# Patient Record
Sex: Female | Born: 1990 | Race: Black or African American | Hispanic: No | Marital: Married | Smoking: Never smoker
Health system: Southern US, Community
[De-identification: ages and names within clinical notes are randomized; demographics above are authoritative.]

## PROBLEM LIST (undated history)

## (undated) DIAGNOSIS — G43909 Migraine, unspecified, not intractable, without status migrainosus: Secondary | ICD-10-CM

## (undated) DIAGNOSIS — N390 Urinary tract infection, site not specified: Secondary | ICD-10-CM

## (undated) DIAGNOSIS — D219 Benign neoplasm of connective and other soft tissue, unspecified: Secondary | ICD-10-CM

## (undated) DIAGNOSIS — R011 Cardiac murmur, unspecified: Secondary | ICD-10-CM

## (undated) DIAGNOSIS — K219 Gastro-esophageal reflux disease without esophagitis: Secondary | ICD-10-CM

## (undated) DIAGNOSIS — K2970 Gastritis, unspecified, without bleeding: Secondary | ICD-10-CM

## (undated) HISTORY — DX: Cardiac murmur, unspecified: R01.1

## (undated) HISTORY — DX: Migraine, unspecified, not intractable, without status migrainosus: G43.909

## (undated) HISTORY — DX: Benign neoplasm of connective and other soft tissue, unspecified: D21.9

## (undated) HISTORY — DX: Urinary tract infection, site not specified: N39.0

## (undated) HISTORY — DX: Gastritis, unspecified, without bleeding: K29.70

---

## 2019-07-04 ENCOUNTER — Other Ambulatory Visit: Payer: Self-pay

## 2019-07-05 ENCOUNTER — Ambulatory Visit (INDEPENDENT_AMBULATORY_CARE_PROVIDER_SITE_OTHER): Payer: Self-pay | Admitting: Internal Medicine

## 2019-07-05 ENCOUNTER — Encounter: Payer: Self-pay | Admitting: Internal Medicine

## 2019-07-05 VITALS — BP 120/64 | HR 91 | Temp 98.0°F | Wt 134.4 lb

## 2019-07-05 DIAGNOSIS — Z01419 Encounter for gynecological examination (general) (routine) without abnormal findings: Secondary | ICD-10-CM

## 2019-07-05 DIAGNOSIS — R350 Frequency of micturition: Secondary | ICD-10-CM

## 2019-07-05 DIAGNOSIS — R519 Headache, unspecified: Secondary | ICD-10-CM

## 2019-07-05 LAB — POCT URINALYSIS DIPSTICK
Bilirubin, UA: NEGATIVE
Blood, UA: NEGATIVE
Glucose, UA: NEGATIVE
Ketones, UA: NEGATIVE
Leukocytes, UA: NEGATIVE
Nitrite, UA: NEGATIVE
Protein, UA: NEGATIVE
Spec Grav, UA: 1.02 (ref 1.010–1.025)
Urobilinogen, UA: 0.2 E.U./dL
pH, UA: 6 (ref 5.0–8.0)

## 2019-07-05 NOTE — Progress Notes (Signed)
New Patient Office Visit     This visit occurred during the SARS-CoV-2 public health emergency.  Safety protocols were in place, including screening questions prior to the visit, additional usage of staff PPE, and extensive cleaning of exam room while observing appropriate contact time as indicated for disinfecting solutions.    CC/Reason for Visit: Establish care, discuss urinary frequency Previous PCP: None Last Visit: Unknown  HPI: Tammy Ballard is a 28 y.o. female who is coming in today for the above mentioned reasons.  She has no past medical history of significance.  She has moved to the area and would like to seek primary care.  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  She is married has 2 children ages 68 and 2 she is a never smoker, does not drink alcohol, has no known drug allergies.  Her only surgeries are 2 C-sections.  Her family history significant for a father with diabetes and a mother with hypertension and rheumatoid arthritis.  Earlier this year she was treated for a UTI twice.  She is still having urinary frequency and would like her urine tested to know if she has had any recurrence.  She has also been having a sinus headache.  Past Medical/Surgical History: History reviewed. No pertinent past medical history.  Past Surgical History:  Procedure Laterality Date  . CESAREAN SECTION     x2    Social History:  reports that she has never smoked. She has never used smokeless tobacco. She reports current alcohol use. She reports that she does not use drugs.  Allergies: No Known Allergies  Family History:  Family History  Problem Relation Age of Onset  . Hypertension Mother   . Rheum arthritis Mother   . Diabetes Father     No current outpatient medications on file.  Review of Systems:  Constitutional: Denies fever, chills, diaphoresis, appetite change and fatigue.  HEENT: Denies photophobia, eye pain, redness, hearing loss, ear  pain, congestion, sore throat, rhinorrhea, sneezing, mouth sores, trouble swallowing, neck pain, neck stiffness and tinnitus.   Respiratory: Denies SOB, DOE, cough, chest tightness,  and wheezing.   Cardiovascular: Denies chest pain, palpitations and leg swelling.  Gastrointestinal: Denies nausea, vomiting, abdominal pain, diarrhea, constipation, blood in stool and abdominal distention.  Genitourinary: Denies dysuria,hematuria, flank pain and difficulty urinating.  Endocrine: Denies: hot or cold intolerance, sweats, changes in hair or nails, polyuria, polydipsia. Musculoskeletal: Denies myalgias, back pain, joint swelling, arthralgias and gait problem.  Skin: Denies pallor, rash and wound.  Neurological: Denies dizziness, seizures, syncope, weakness, light-headedness, numbness and headaches.  Hematological: Denies adenopathy. Easy bruising, personal or family bleeding history  Psychiatric/Behavioral: Denies suicidal ideation, mood changes, confusion, nervousness, sleep disturbance and agitation    Physical Exam: Vitals:   07/05/19 1430  BP: 120/64  Pulse: 91  Temp: 98 F (36.7 C)  TempSrc: Temporal  SpO2: 98%  Weight: 134 lb 6.4 oz (61 kg)   There is no height or weight on file to calculate BMI.   Constitutional: NAD, calm, comfortable Eyes: PERRL, lids and conjunctivae normal ENMT: Mucous membranes are moist.  Respiratory: clear to auscultation bilaterally, no wheezing, no crackles. Normal respiratory effort. No accessory muscle use.  Cardiovascular: Regular rate and rhythm, no murmurs / rubs / gallops. No extremity edema. 2+ pedal pulses.   Abdomen: no tenderness, no masses palpated. No hepatosplenomegaly. Bowel sounds positive.  Musculoskeletal: no clubbing / cyanosis. No joint deformity upper and lower extremities. Good  ROM, no contractures. Normal muscle tone.  Skin: no rashes, lesions, ulcers. No induration Neurologic: Grossly intact and nonfocal  Psychiatric: Normal  judgment and insight. Alert and oriented x 3. Normal mood.    Impression and Plan:  Urinary frequency  -Dipstick is without leukocytes, blood or nitrites.  Do not believe we need to test her for the UTI.  Sinus headache -Have advised use of Mucinex, antihistamines and decongestants as needed.  Well woman exam  - Plan: Ambulatory referral to Gynecology per patient request.  She will return for CPE.     Patient Instructions  -Nice seeing you today!!  -Start claritin or zyrtec daily for sinus headaches. May also use saline nasal spray as needed.  -Schedule follow up for your physical, come in fasting that day.     Lelon Frohlich, MD [facility] ]

## 2019-07-05 NOTE — Patient Instructions (Signed)
-  Nice seeing you today!!  -Start claritin or zyrtec daily for sinus headaches. May also use saline nasal spray as needed.  -Schedule follow up for your physical, come in fasting that day.

## 2019-10-25 ENCOUNTER — Ambulatory Visit: Payer: Self-pay | Admitting: Internal Medicine

## 2019-10-25 DIAGNOSIS — Z0289 Encounter for other administrative examinations: Secondary | ICD-10-CM

## 2019-11-16 ENCOUNTER — Emergency Department (HOSPITAL_COMMUNITY): Payer: PRIVATE HEALTH INSURANCE

## 2019-11-16 ENCOUNTER — Encounter (HOSPITAL_COMMUNITY): Payer: Self-pay

## 2019-11-16 ENCOUNTER — Other Ambulatory Visit: Payer: Self-pay

## 2019-11-16 ENCOUNTER — Emergency Department (HOSPITAL_COMMUNITY)
Admission: EM | Admit: 2019-11-16 | Discharge: 2019-11-16 | Disposition: A | Discharge status: Home / Self Care | Payer: Self-pay | Attending: Emergency Medicine | Admitting: Emergency Medicine

## 2019-11-16 DIAGNOSIS — R0789 Other chest pain: Secondary | ICD-10-CM | POA: Insufficient documentation

## 2019-11-16 DIAGNOSIS — R079 Chest pain, unspecified: Secondary | ICD-10-CM

## 2019-11-16 LAB — BASIC METABOLIC PANEL
Anion gap: 9 (ref 5–15)
BUN: 8 mg/dL (ref 6–20)
CO2: 27 mmol/L (ref 22–32)
Calcium: 10.2 mg/dL (ref 8.9–10.3)
Chloride: 104 mmol/L (ref 98–111)
Creatinine, Ser: 0.7 mg/dL (ref 0.44–1.00)
GFR calc Af Amer: >60 mL/min (ref >60)
GFR calc non Af Amer: >60 mL/min (ref >60)
Glucose, Bld: 106 mg/dL — ABNORMAL HIGH (ref 70–99)
Potassium: 3.8 mmol/L (ref 3.5–5.1)
Sodium: 140 mmol/L (ref 135–145)

## 2019-11-16 LAB — I-STAT BETA HCG BLOOD, ED (MC, WL, AP ONLY): I-stat hCG, quantitative: <5 m[IU]/mL (ref <5)

## 2019-11-16 LAB — CBC
HCT: 39.3 % (ref 36.0–46.0)
Hemoglobin: 13.1 g/dL (ref 12.0–15.0)
MCH: 30.2 pg (ref 26.0–34.0)
MCHC: 33.3 g/dL (ref 30.0–36.0)
MCV: 90.6 fL (ref 80.0–100.0)
Platelets: 255 10*3/uL (ref 150–400)
RBC: 4.34 MIL/uL (ref 3.87–5.11)
RDW: 13 % (ref 11.5–15.5)
WBC: 7.2 10*3/uL (ref 4.0–10.5)
nRBC: 0 % (ref 0.0–0.2)

## 2019-11-16 LAB — D-DIMER, QUANTITATIVE: D-Dimer, Quant: <0.27 ug/mL-FEU (ref 0.00–0.50)

## 2019-11-16 LAB — TROPONIN I (HIGH SENSITIVITY): Troponin I (High Sensitivity): <2 ng/L (ref <18)

## 2019-11-16 MED ORDER — SODIUM CHLORIDE 0.9% FLUSH: 3.0000 mL | Freq: Once | INTRAVENOUS | Status: DC

## 2019-11-16 NOTE — ED Triage Notes (Signed)
Patient c/o intermittent left chest pain since yesterday. Patient states she took Tums which helped some but returned in the night. Patient denies any SOB, N/V, or diaphoresis.

## 2019-11-16 NOTE — ED Provider Notes (Addendum)
[facility] DEPT Provider Note   CSN: [id number] Arrival date & time: 11/16/19  1287     History Chief Complaint  Patient presents with  . Chest Pain    Tammy Ballard is a 29 y.o. female.  HPI 29 year old female presents with left-sided chest pain.  Started yesterday but then went away.  Has been recurring on and off throughout the night and seems to wake her up.  Feels like indigestion but she took Pepto-Bismol with no relief.  The pain is not pleuritic.  Nothing she does such as position makes it worse.  There is no shortness of breath, abdominal pain, vomiting.  The pain is only about a 3 out of 10 and is a mixture between sharp and dull.  No family history of MI.   Past Medical History:  Diagnosis Date  . Heart murmur   . Migraines   . UTI (urinary tract infection)     There are no problems to display for this patient.   Past Surgical History:  Procedure Laterality Date  . CESAREAN SECTION     x2     OB History   No obstetric history on file.     Family History  Problem Relation Age of Onset  . Hypertension Mother   . Rheum arthritis Mother   . Diabetes Father     Social History   Tobacco Use  . Smoking status: Never Smoker  . Smokeless tobacco: Never Used  Substance Use Topics  . Alcohol use: Yes    Comment: occasional  . Drug use: Never    Home Medications Prior to Admission medications   Not on File    Allergies    Patient has no known allergies.  Review of Systems   Review of Systems  Respiratory: Negative for shortness of breath.   Cardiovascular: Positive for chest pain.  Gastrointestinal: Negative for abdominal pain and vomiting.  All other systems reviewed and are negative.   Physical Exam Updated Vital Signs BP 101/66   Pulse 78   Temp 98.6 F (37 C) (Oral)   Resp 17   Ht 5' (1.524 m)   Wt 59 kg   LMP 10/18/2019   SpO2 100%   BMI 25.39 kg/m   Physical Exam Vitals and nursing note  reviewed.  Constitutional:      General: She is not in acute distress.    Appearance: She is well-developed. She is not ill-appearing or diaphoretic.  HENT:     Head: Normocephalic and atraumatic.     Right Ear: External ear normal.     Left Ear: External ear normal.     Nose: Nose normal.  Eyes:     General:        Right eye: No discharge.        Left eye: No discharge.  Cardiovascular:     Rate and Rhythm: Regular rhythm. Tachycardia present.     Heart sounds: Normal heart sounds.     Comments: HR 90s-100 Pulmonary:     Effort: Pulmonary effort is normal.     Breath sounds: Normal breath sounds.  Abdominal:     Palpations: Abdomen is soft.     Tenderness: There is no abdominal tenderness.  Skin:    General: Skin is warm and dry.  Neurological:     Mental Status: She is alert.  Psychiatric:        Mood and Affect: Mood is anxious.     ED Results /  Procedures / Treatments   Labs (all labs ordered are listed, but only abnormal results are displayed) Labs Reviewed  BASIC METABOLIC PANEL - Abnormal; Notable for the following components:      Result Value   Glucose, Bld 106 (*)    All other components within normal limits  CBC  D-DIMER, QUANTITATIVE (NOT AT [facility])  I-STAT BETA HCG BLOOD, ED ([facility], [facility], [facility] ONLY)  TROPONIN I (HIGH SENSITIVITY)    EKG EKG Interpretation  Date/Time:  Friday November 16 2019 08:43:22 EDT Ventricular Rate:  100 PR Interval:    QRS Duration: 91 QT Interval:  375 QTC Calculation: 484 R Axis:   81 Text Interpretation: Sinus tachycardia Right atrial enlargement Borderline T abnormalities, diffuse leads Borderline prolonged QT interval No old tracing to compare Confirmed by Pricilla Loveless [phone]) on 11/16/2019 8:58:46 AM   Radiology DG Chest 2 View  Result Date: 11/16/2019 CLINICAL DATA:  Chest pain EXAM: CHEST - 2 VIEW COMPARISON:  None. FINDINGS: The heart size and mediastinal contours are within normal limits. Both lungs are clear. The  visualized skeletal structures are unremarkable. IMPRESSION: No active cardiopulmonary disease. Electronically Signed   By: Signa Kell M.D.   On: 11/16/2019 09:27    Procedures Procedures (including critical care time)  Medications Ordered in ED Medications  sodium chloride flush (NS) 0.9 % injection 3 mL (has no administration in time range)    ED Course  I have reviewed the triage vital signs and the nursing notes.  Pertinent labs & imaging results that were available during my care of the patient were reviewed by me and considered in my medical decision making (see chart for details).    MDM Rules/Calculators/A&P HEAR Score: 1                    Patient has been having waxing and waning pain since last night.  Given this, combined with a low heart score, low suspicion for ACS, negative troponin, etc. I have low suspicion that this is ACS.  I do not think she needs second troponin given length of time of symptoms.  There is no exertional component.  Given her mild tachycardia on arrival, D-dimer was sent for otherwise low risk PE which is negative.  Could be GI in nature, recommend H2 blocker and PPI which she states she will get over-the-counter.  Return precautions discussed, otherwise follow-up with PCP.  I have personally reviewed her labs, ECG, and chest x-ray.  The ECG probably has some mild nonspecific T wave findings because of the tachycardia.  Final Clinical Impression(s) / ED Diagnoses Final diagnoses:  Nonspecific chest pain    Rx / DC Orders ED Discharge Orders    None       Pricilla Loveless, MD 11/16/19 1013    Pricilla Loveless, MD 11/16/19 1014

## 2019-11-16 NOTE — Discharge Instructions (Addendum)
If you develop recurrent, continued, or worsening chest pain, shortness of breath, fever, vomiting, abdominal or back pain, or any other new/concerning symptoms then return to the ER for evaluation.  

## 2019-12-18 ENCOUNTER — Other Ambulatory Visit: Payer: Self-pay

## 2019-12-19 ENCOUNTER — Ambulatory Visit: Payer: Self-pay | Admitting: Internal Medicine

## 2019-12-19 DIAGNOSIS — Z0289 Encounter for other administrative examinations: Secondary | ICD-10-CM

## 2020-02-15 ENCOUNTER — Encounter: Payer: Self-pay | Admitting: Internal Medicine

## 2020-03-14 ENCOUNTER — Ambulatory Visit (INDEPENDENT_AMBULATORY_CARE_PROVIDER_SITE_OTHER): Payer: PRIVATE HEALTH INSURANCE | Admitting: Family Medicine

## 2020-03-14 ENCOUNTER — Encounter: Payer: Self-pay | Admitting: Family Medicine

## 2020-03-14 ENCOUNTER — Other Ambulatory Visit: Payer: Self-pay

## 2020-03-14 VITALS — BP 110/70 | HR 80 | Temp 98.4°F | Wt 132.0 lb

## 2020-03-14 DIAGNOSIS — R300 Dysuria: Secondary | ICD-10-CM | POA: Diagnosis not present

## 2020-03-14 LAB — POC URINALSYSI DIPSTICK (AUTOMATED)
Bilirubin, UA: NEGATIVE
Glucose, UA: NEGATIVE
Ketones, UA: NEGATIVE
Leukocytes, UA: NEGATIVE
Nitrite, UA: NEGATIVE
Protein, UA: NEGATIVE
Spec Grav, UA: 1.02 (ref 1.010–1.025)
Urobilinogen, UA: 0.2 E.U./dL
pH, UA: 6.5 (ref 5.0–8.0)

## 2020-03-14 MED ORDER — PHENAZOPYRIDINE HCL 100 MG PO TABS: 100.0000 mg | ORAL_TABLET | Freq: Three times a day (TID) | ORAL | 0 refills | Status: AC | PRN

## 2020-03-14 NOTE — Progress Notes (Signed)
Subjective:    Patient ID: Tammy Ballard, female    DOB: 10/23/1990, 29 y.o.   MRN: [id number]  No chief complaint on file.   HPI Patient was seen today for acute concern.  Pt endorses urinary frequency, urgency, and dysuria x 3 days.  Pt states symptoms feel like when she has had a UTI in the past. Also with n, suprapubic pain and back pain.  Pt denies fever, chills, constipation, vaginal discharge.  Pt notes unable to drink much fluids 2/2 pain.  Typically drinks at least 8 glasses of water per day.  Menses started today.  Past Medical History:  Diagnosis Date  . Heart murmur   . Migraines   . UTI (urinary tract infection)     No Known Allergies  ROS General: Denies fever, chills, night sweats, changes in weight, changes in appetite HEENT: Denies headaches, ear pain, changes in vision, rhinorrhea, sore throat CV: Denies CP, palpitations, SOB, orthopnea Pulm: Denies SOB, cough, wheezing GI: Denies abdominal pain, nausea, vomiting, diarrhea, constipation GU: Denies hematuria, vaginal discharge  + frequency, urgency, dysuria Msk: Denies muscle cramps, joint pains Neuro: Denies weakness, numbness, tingling Skin: Denies rashes, bruising Psych: Denies depression, anxiety, hallucinations     Objective:    Blood pressure 110/70, pulse 80, temperature 98.4 F (36.9 C), temperature source Oral, weight 132 lb (59.9 kg), SpO2 98 %.   Gen. Pleasant, well-nourished, in no distress, normal affect   HEENT: [place]/AT, face symmetric, no scleral icterus, PERRLA, EOMI, nares patent without drainage Lungs: no accessory muscle use Cardiovascular: RRR,  no peripheral edema Abdomen: BS present, soft, suprapubic pain bilaterally,/ND, no hepatosplenomegaly.  No CVA tenderness Neuro:  A&Ox3, CN II-XII intact, normal gait Skin:  Warm, no lesions/ rash   Wt Readings from Last 3 Encounters:  11/16/19 130 lb (59 kg)  07/05/19 134 lb 6.4 oz (61 kg)    Lab Results  Component Value Date   WBC  7.2 11/16/2019   HGB 13.1 11/16/2019   HCT 39.3 11/16/2019   PLT 255 11/16/2019   GLUCOSE 106 (H) 11/16/2019   NA 140 11/16/2019   K 3.8 11/16/2019   CL 104 11/16/2019   CREATININE 0.70 11/16/2019   BUN 8 11/16/2019   CO2 27 11/16/2019    Assessment/Plan:  Dysuria  -UA with 1+ blood, SG 1.020.  1+ blood likely 2/2 start of menses -We will send for urine culture. -Patient encouraged to increase p.o. intake of water and fluids. -We will start Pyridium x2 days given discomfort -We will send in antibiotics if needed based on culture. -Given precautions - Plan: phenazopyridine (PYRIDIUM) 100 MG tablet, Urine Culture, POCT Urinalysis Dipstick (Automated), Urine Culture  F/u as needed  Abbe Amsterdam, MD

## 2020-03-14 NOTE — Patient Instructions (Signed)

## 2020-03-16 LAB — URINE CULTURE
MICRO NUMBER:: 10853078
SPECIMEN QUALITY:: ADEQUATE

## 2020-03-17 ENCOUNTER — Other Ambulatory Visit: Payer: Self-pay | Admitting: Family Medicine

## 2020-03-17 DIAGNOSIS — N3000 Acute cystitis without hematuria: Secondary | ICD-10-CM

## 2020-03-17 MED ORDER — NITROFURANTOIN MONOHYD MACRO 100 MG PO CAPS: 100.0000 mg | ORAL_CAPSULE | Freq: Two times a day (BID) | ORAL | 0 refills | Status: AC

## 2020-03-27 ENCOUNTER — Encounter: Payer: Self-pay | Admitting: Internal Medicine

## 2020-03-27 ENCOUNTER — Other Ambulatory Visit: Payer: Self-pay

## 2020-03-27 ENCOUNTER — Ambulatory Visit (INDEPENDENT_AMBULATORY_CARE_PROVIDER_SITE_OTHER): Payer: PRIVATE HEALTH INSURANCE | Admitting: Internal Medicine

## 2020-03-27 VITALS — BP 102/68 | HR 81 | Temp 98.2°F | Wt 133.1 lb

## 2020-03-27 DIAGNOSIS — R109 Unspecified abdominal pain: Secondary | ICD-10-CM

## 2020-03-27 DIAGNOSIS — R319 Hematuria, unspecified: Secondary | ICD-10-CM

## 2020-03-27 LAB — POCT URINALYSIS DIPSTICK
Bilirubin, UA: NEGATIVE
Blood, UA: NEGATIVE
Glucose, UA: NEGATIVE
Ketones, UA: NEGATIVE
Leukocytes, UA: NEGATIVE
Nitrite, UA: NEGATIVE
Protein, UA: NEGATIVE
Spec Grav, UA: 1.015 (ref 1.010–1.025)
Urobilinogen, UA: 0.2 U/dL
pH, UA: 5.5 (ref 5.0–8.0)

## 2020-03-27 NOTE — Patient Instructions (Signed)
-  Nice seeing you today!!  -Will send you for a CT to look for kidney stones. In the meantime drink plenty of water.

## 2020-03-27 NOTE — Progress Notes (Signed)
Established Patient Office Visit     This visit occurred during the SARS-CoV-2 public health emergency.  Safety protocols were in place, including screening questions prior to the visit, additional usage of staff PPE, and extensive cleaning of exam room while observing appropriate contact time as indicated for disinfecting solutions.    CC/Reason for Visit: Continued flank pain and hematuria  HPI: Tammy Ballard is a 29 y.o. female who is coming in today for the above mentioned reasons.  She has no past medical history of significance.  Last week she was treated for a staph saprophyticus UTI with Macrobid.  She feels like her symptoms never truly improved.  Now she has noticed increasing hematuria and bilateral flank pain.  She denies fever, chills, dysuria, she always has increased frequency because she drinks over a gallon of water a day typically.   Past Medical/Surgical History: Past Medical History:  Diagnosis Date  . Heart murmur   . Migraines   . UTI (urinary tract infection)     Past Surgical History:  Procedure Laterality Date  . CESAREAN SECTION     x2    Social History:  reports that she has never smoked. She has never used smokeless tobacco. She reports current alcohol use. She reports that she does not use drugs.  Allergies: No Known Allergies  Family History:  Family History  Problem Relation Age of Onset  . Hypertension Mother   . Rheum arthritis Mother   . Diabetes Father     No current outpatient medications on file.  Review of Systems:  Constitutional: Denies fever, chills, diaphoresis, appetite change and fatigue.  HEENT: Denies photophobia, eye pain, redness, hearing loss, ear pain, congestion, sore throat, rhinorrhea, sneezing, mouth sores, trouble swallowing, neck pain, neck stiffness and tinnitus.   Respiratory: Denies SOB, DOE, cough, chest tightness,  and wheezing.   Cardiovascular: Denies chest pain, palpitations and leg swelling.    Gastrointestinal: Denies nausea, vomiting, abdominal pain, diarrhea, constipation, blood in stool and abdominal distention.  Genitourinary: Denies dysuria, urgency, frequency, hematuria, flank pain and difficulty urinating.  Endocrine: Denies: hot or cold intolerance, sweats, changes in hair or nails, polyuria, polydipsia. Musculoskeletal: Denies myalgias, back pain, joint swelling, arthralgias and gait problem.  Skin: Denies pallor, rash and wound.  Neurological: Denies dizziness, seizures, syncope, weakness, light-headedness, numbness and headaches.  Hematological: Denies adenopathy. Easy bruising, personal or family bleeding history  Psychiatric/Behavioral: Denies suicidal ideation, mood changes, confusion, nervousness, sleep disturbance and agitation    Physical Exam: Vitals:   03/27/20 0701  BP: 102/68  Pulse: 81  Temp: 98.2 F (36.8 C)  TempSrc: Oral  SpO2: 98%  Weight: 133 lb 1.6 oz (60.4 kg)    Body mass index is 25.99 kg/m.    Constitutional: NAD, calm, comfortable Eyes: PERRL, lids and conjunctivae normal ENMT: Mucous membranes are moist. Respiratory: clear to auscultation bilaterally, no wheezing, no crackles. Normal respiratory effort. No accessory muscle use.  Cardiovascular: Regular rate and rhythm, no murmurs / rubs / gallops. No extremity edema. 2+ pedal pulses. No carotid bruits.  Abdomen: Bilateral CVA tenderness.  No hepatosplenomegaly. Bowel sounds positive.  Neurologic: Grossly intact and nonfocal Psychiatric: Normal judgment and insight. Alert and oriented x 3. Normal mood.    Impression and Plan:  Hematuria, unspecified type Bilateral flank pain  -Given her hematuria and recent treatment for UTI as well as her history of recurrent UTIs, I feel it is appropriate to do a CT abdomen stone protocol to rule out  nephrolithiasis. -She was offered IM Toradol today which she has refused, she will continue to take as needed OTC NSAIDs and drink copious  amounts of fluid. -Urine dipstick today is negative for nitrates, leukocytes, blood.    Patient Instructions  -Nice seeing you today!!  -Will send you for a CT to look for kidney stones. In the meantime drink plenty of water.     Chaya Jan, MD [facility]

## 2020-04-10 ENCOUNTER — Other Ambulatory Visit: Payer: PRIVATE HEALTH INSURANCE

## 2020-04-23 ENCOUNTER — Ambulatory Visit
Admission: RE | Admit: 2020-04-23 | Discharge: 2020-04-23 | Disposition: A | Discharge status: Home / Self Care | Payer: PRIVATE HEALTH INSURANCE | Source: Ambulatory Visit | Attending: Internal Medicine | Admitting: Internal Medicine

## 2020-04-23 ENCOUNTER — Other Ambulatory Visit: Payer: Self-pay

## 2020-04-23 DIAGNOSIS — R319 Hematuria, unspecified: Secondary | ICD-10-CM

## 2020-04-23 DIAGNOSIS — R109 Unspecified abdominal pain: Secondary | ICD-10-CM

## 2020-04-24 ENCOUNTER — Other Ambulatory Visit: Payer: Self-pay | Admitting: *Deleted

## 2020-04-24 DIAGNOSIS — R109 Unspecified abdominal pain: Secondary | ICD-10-CM

## 2020-04-24 NOTE — Progress Notes (Signed)
amb ur

## 2020-05-21 ENCOUNTER — Other Ambulatory Visit: Payer: Self-pay

## 2020-05-21 ENCOUNTER — Ambulatory Visit (INDEPENDENT_AMBULATORY_CARE_PROVIDER_SITE_OTHER): Payer: PRIVATE HEALTH INSURANCE | Admitting: Internal Medicine

## 2020-05-21 ENCOUNTER — Encounter: Payer: Self-pay | Admitting: Internal Medicine

## 2020-05-21 VITALS — BP 110/70 | HR 69 | Temp 98.5°F | Ht 60.0 in | Wt 132.3 lb

## 2020-05-21 DIAGNOSIS — Z0000 Encounter for general adult medical examination without abnormal findings: Secondary | ICD-10-CM

## 2020-05-21 DIAGNOSIS — N3001 Acute cystitis with hematuria: Secondary | ICD-10-CM | POA: Diagnosis not present

## 2020-05-21 DIAGNOSIS — R300 Dysuria: Secondary | ICD-10-CM | POA: Diagnosis not present

## 2020-05-21 DIAGNOSIS — Z124 Encounter for screening for malignant neoplasm of cervix: Secondary | ICD-10-CM

## 2020-05-21 LAB — POCT URINALYSIS DIPSTICK
Bilirubin, UA: NEGATIVE
Glucose, UA: NEGATIVE
Ketones, UA: NEGATIVE
Nitrite, UA: POSITIVE
Protein, UA: NEGATIVE
Spec Grav, UA: 1.015 (ref 1.010–1.025)
Urobilinogen, UA: 0.2 E.U./dL
pH, UA: 6 (ref 5.0–8.0)

## 2020-05-21 MED ORDER — SULFAMETHOXAZOLE-TRIMETHOPRIM 800-160 MG PO TABS: 1.0000 | ORAL_TABLET | Freq: Two times a day (BID) | ORAL | 0 refills | Status: DC

## 2020-05-21 NOTE — Patient Instructions (Signed)
-Nice seeing you today!!  -Start bactrim DS 1 tablet twice daily for 7 days.  -You are due for flu, COVID and tetanus vaccinations.  -Schedule follow up in 1 year or sooner as needed.   Preventive Care 92-31 Years Old, Female Preventive care refers to visits with your health care provider and lifestyle choices that can promote health and wellness. This includes:  A yearly physical exam. This may also be called an annual well check.  Regular dental visits and eye exams.  Immunizations.  Screening for certain conditions.  Healthy lifestyle choices, such as eating a healthy diet, getting regular exercise, not using drugs or products that contain nicotine and tobacco, and limiting alcohol use. What can I expect for my preventive care visit? Physical exam Your health care provider will check your:  Height and weight. This may be used to calculate body mass index (BMI), which tells if you are at a healthy weight.  Heart rate and blood pressure.  Skin for abnormal spots. Counseling Your health care provider may ask you questions about your:  Alcohol, tobacco, and drug use.  Emotional well-being.  Home and relationship well-being.  Sexual activity.  Eating habits.  Work and work Statistician.  Method of birth control.  Menstrual cycle.  Pregnancy history. What immunizations do I need?  Influenza (flu) vaccine  This is recommended every year. Tetanus, diphtheria, and pertussis (Tdap) vaccine  You may need a Td booster every 10 years. Varicella (chickenpox) vaccine  You may need this if you have not been vaccinated. Human papillomavirus (HPV) vaccine  If recommended by your health care provider, you may need three doses over 6 months. Measles, mumps, and rubella (MMR) vaccine  You may need at least one dose of MMR. You may also need a second dose. Meningococcal conjugate (MenACWY) vaccine  One dose is recommended if you are age 31-21 years and a first-year  college student living in a residence hall, or if you have one of several medical conditions. You may also need additional booster doses. Pneumococcal conjugate (PCV13) vaccine  You may need this if you have certain conditions and were not previously vaccinated. Pneumococcal polysaccharide (PPSV23) vaccine  You may need one or two doses if you smoke cigarettes or if you have certain conditions. Hepatitis A vaccine  You may need this if you have certain conditions or if you travel or work in places where you may be exposed to hepatitis A. Hepatitis B vaccine  You may need this if you have certain conditions or if you travel or work in places where you may be exposed to hepatitis B. Haemophilus influenzae type b (Hib) vaccine  You may need this if you have certain conditions. You may receive vaccines as individual doses or as more than one vaccine together in one shot (combination vaccines). Talk with your health care provider about the risks and benefits of combination vaccines. What tests do I need?  Blood tests  Lipid and cholesterol levels. These may be checked every 5 years starting at age 31.  Hepatitis C test.  Hepatitis B test. Screening  Diabetes screening. This is done by checking your blood sugar (glucose) after you have not eaten for a while (fasting).  Sexually transmitted disease (STD) testing.  BRCA-related cancer screening. This may be done if you have a family history of breast, ovarian, tubal, or peritoneal cancers.  Pelvic exam and Pap test. This may be done every 3 years starting at age 31. Starting at age 31, this  may be done every 5 years if you have a Pap test in combination with an HPV test. Talk with your health care provider about your test results, treatment options, and if necessary, the need for more tests. Follow these instructions at home: Eating and drinking   Eat a diet that includes fresh fruits and vegetables, whole grains, lean protein, and  low-fat dairy.  Take vitamin and mineral supplements as recommended by your health care provider.  Do not drink alcohol if: ? Your health care provider tells you not to drink. ? You are pregnant, may be pregnant, or are planning to become pregnant.  If you drink alcohol: ? Limit how much you have to 0-1 drink a day. ? Be aware of how much alcohol is in your drink. In the [place] one drink equals one 12 oz bottle of beer (355 mL), one 5 oz glass of wine (148 mL), or one 1 oz glass of hard liquor (44 mL). Lifestyle  Take daily care of your teeth and gums.  Stay active. Exercise for at least 30 minutes on 5 or more days each week.  Do not use any products that contain nicotine or tobacco, such as cigarettes, e-cigarettes, and chewing tobacco. If you need help quitting, ask your health care provider.  If you are sexually active, practice safe sex. Use a condom or other form of birth control (contraception) in order to prevent pregnancy and STIs (sexually transmitted infections). If you plan to become pregnant, see your health care provider for a preconception visit. What's next?  Visit your health care provider once a year for a well check visit.  Ask your health care provider how often you should have your eyes and teeth checked.  Stay up to date on all vaccines. This information is not intended to replace advice given to you by your health care provider. Make sure you discuss any questions you have with your health care provider. Document Revised: 03/23/2018 Document Reviewed: 03/23/2018 Elsevier Patient Education  2020 [employer].

## 2020-05-21 NOTE — Progress Notes (Signed)
**Note DeTammyIdentified via Obfuscation** Established Patient Office Visit     This visit occurred during the SARSTammyCoVTammy2 public health emergency.  Safety protocols were in place, including screening questions prior to the visit, additional usage of staff PPE, and extensive cleaning of exam room while observing appropriate contact time as indicated for disinfecting solutions.    CC/Reason for Visit: Annual preventive exam, dysuria   HPI: Tammy Ballard is a 29 y.o. female who is coming in today for the above mentioned reasons.  She has no past medical history of significance.  She again feels like she has a urinary tract infection.  She has dysuria and frequency.  She recently applied for life insurance and had labs done and is wondering if these need to be repeated today.  She has routine dental care but no eye care.  She is due for flu, Tdap, COVID vaccines.  She is requesting referral to GYN.   Past Medical/Surgical History: Past Medical History:  Diagnosis Date  . Heart murmur   . Migraines   . UTI (urinary tract infection)     Past Surgical History:  Procedure Laterality Date  . CESAREAN SECTION     x2    Social History:  reports that she has never smoked. She has never used smokeless tobacco. She reports current alcohol use. She reports that she does not use drugs.  Allergies: No Known Allergies  Family History:  Family History  Problem Relation Age of Onset  . Hypertension Mother   . Rheum arthritis Mother   . Diabetes Father      Current Outpatient Medications:  .  sulfamethoxazoleTammytrimethoprim (BACTRIM DS) 800Tammy160 MG tablet, Take 1 tablet by mouth 2 (two) times daily for 7 days., Disp: 14 tablet, Rfl: 0  Review of Systems:  Constitutional: Denies fever, chills, diaphoresis, appetite change and fatigue.  HEENT: Denies photophobia, eye pain, redness, hearing loss, ear pain, congestion, sore throat, rhinorrhea, sneezing, mouth sores, trouble swallowing, neck pain, neck stiffness and tinnitus.     Respiratory: Denies SOB, DOE, cough, chest tightness,  and wheezing.   Cardiovascular: Denies chest pain, palpitations and leg swelling.  Gastrointestinal: Denies nausea, vomiting, abdominal pain, diarrhea, constipation, blood in stool and abdominal distention.  Genitourinary: Denies  flank pain and difficulty urinating.  Endocrine: Denies: hot or cold intolerance, sweats, changes in hair or nails, polyuria, polydipsia. Musculoskeletal: Denies myalgias, back pain, joint swelling, arthralgias and gait problem.  Skin: Denies pallor, rash and wound.  Neurological: Denies dizziness, seizures, syncope, weakness, lightTammyheadedness, numbness and headaches.  Hematological: Denies adenopathy. Easy bruising, personal or family bleeding history  Psychiatric/Behavioral: Denies suicidal ideation, mood changes, confusion, nervousness, sleep disturbance and agitation    Physical Exam: Vitals:   05/21/20 1148  BP: 110/70  Pulse: 69  Temp: 98.5 F (36.9 C)  TempSrc: Oral  SpO2: 99%  Weight: 132 lb 4.8 oz (60 kg)  Height: 5' (1.524 m)    Body mass index is 25.84 kg/m.   Constitutional: NAD, calm, comfortable Eyes: PERRL, lids and conjunctivae normal ENMT: Mucous membranes are moist. Posterior pharynx clear of any exudate or lesions. Normal dentition. Tympanic membrane is pearly white, no erythema or bulging. Neck: normal, supple, no masses, no thyromegaly Respiratory: clear to auscultation bilaterally, no wheezing, no crackles. Normal respiratory effort. No accessory muscle use.  Cardiovascular: Regular rate and rhythm, no murmurs / rubs / gallops. No extremity edema. 2+ pedal pulses. No carotid bruits.  Abdomen: no tenderness, no masses palpated. No hepatosplenomegaly. Bowel sounds positive.  Musculoskeletal:  no clubbing / cyanosis. No joint deformity upper and lower extremities. Good ROM, no contractures. Normal muscle tone.  Skin: no rashes, lesions, ulcers. No induration Neurologic: CN 2Tammy12  grossly intact. Sensation intact, DTR normal. Strength 5/5 in all 4.  Psychiatric: Normal judgment and insight. Alert and oriented x 3. Normal mood.    Impression and Plan:  Screening for cervical cancer  - Plan: Ambulatory referral to Gynecology  Encounter for preventive health examination -She has routine dental care, have advised routine eye care. -She is due for Covid, flu, Tdap vaccines, she declines administration of all today. -I will await review of her recent labs that she will have sent over to the office. -Healthy lifestyle discussed in detail. -GYN referral per request for cervical cancer screening and well woman exam. -Commence routine breast cancer screening age 25 and colon cancer screening age 60.  Acute cystitis with hematuria  -Urine dipstick in office today is consistent with a UTI with positive nitrates, leukocytes and blood. -Start Bactrim DS for 7 days, sent for urine culture.   Patient Instructions  -Nice seeing you today!!  -Start bactrim DS 1 tablet twice daily for 7 days.  -You are due for flu, COVID and tetanus vaccinations.  -Schedule follow up in 1 year or sooner as needed.   Preventive Care 44Tammy46 Years Old, Female Preventive care refers to visits with your health care provider and lifestyle choices that can promote health and wellness. This includes:  A yearly physical exam. This may also be called an annual well check.  Regular dental visits and eye exams.  Immunizations.  Screening for certain conditions.  Healthy lifestyle choices, such as eating a healthy diet, getting regular exercise, not using drugs or products that contain nicotine and tobacco, and limiting alcohol use. What can I expect for my preventive care visit? Physical exam Your health care provider will check your:  Height and weight. This may be used to calculate body mass index (BMI), which tells if you are at a healthy weight.  Heart rate and blood pressure.  Skin  for abnormal spots. Counseling Your health care provider may ask you questions about your:  Alcohol, tobacco, and drug use.  Emotional wellTammybeing.  Home and relationship wellTammybeing.  Sexual activity.  Eating habits.  Work and work Statistician.  Method of birth control.  Menstrual cycle.  Pregnancy history. What immunizations do I need?  Influenza (flu) vaccine  This is recommended every year. Tetanus, diphtheria, and pertussis (Tdap) vaccine  You may need a Td booster every 10 years. Varicella (chickenpox) vaccine  You may need this if you have not been vaccinated. Human papillomavirus (HPV) vaccine  If recommended by your health care provider, you may need three doses over 6 months. Measles, mumps, and rubella (MMR) vaccine  You may need at least one dose of MMR. You may also need a second dose. Meningococcal conjugate (MenACWY) vaccine  One dose is recommended if you are age 80Tammy21 years and a firstTammyyear college student living in a residence hall, or if you have one of several medical conditions. You may also need additional booster doses. Pneumococcal conjugate (PCV13) vaccine  You may need this if you have certain conditions and were not previously vaccinated. Pneumococcal polysaccharide (PPSV23) vaccine  You may need one or two doses if you smoke cigarettes or if you have certain conditions. Hepatitis A vaccine  You may need this if you have certain conditions or if you travel or work in places where you may  be exposed to hepatitis A. Hepatitis B vaccine  You may need this if you have certain conditions or if you travel or work in places where you may be exposed to hepatitis B. Haemophilus influenzae type b (Hib) vaccine  You may need this if you have certain conditions. You may receive vaccines as individual doses or as more than one vaccine together in one shot (combination vaccines). Talk with your health care provider about the risks and benefits of  combination vaccines. What tests do I need?  Blood tests  Lipid and cholesterol levels. These may be checked every 5 years starting at age 85.  Hepatitis C test.  Hepatitis B test. Screening  Diabetes screening. This is done by checking your blood sugar (glucose) after you have not eaten for a while (fasting).  Sexually transmitted disease (STD) testing.  BRCATammyrelated cancer screening. This may be done if you have a family history of breast, ovarian, tubal, or peritoneal cancers.  Pelvic exam and Pap test. This may be done every 3 years starting at age 44. Starting at age 26, this may be done every 5 years if you have a Pap test in combination with an HPV test. Talk with your health care provider about your test results, treatment options, and if necessary, the need for more tests. Follow these instructions at home: Eating and drinking   Eat a diet that includes fresh fruits and vegetables, whole grains, lean protein, and lowTammyfat dairy.  Take vitamin and mineral supplements as recommended by your health care provider.  Do not drink alcohol if: ? Your health care provider tells you not to drink. ? You are pregnant, may be pregnant, or are planning to become pregnant.  If you drink alcohol: ? Limit how much you have to 0Tammy1 drink a day. ? Be aware of how much alcohol is in your drink. In the [place] one drink equals one 12 oz bottle of beer (355 mL), one 5 oz glass of wine (148 mL), or one 1 oz glass of hard liquor (44 mL). Lifestyle  Take daily care of your teeth and gums.  Stay active. Exercise for at least 30 minutes on 5 or more days each week.  Do not use any products that contain nicotine or tobacco, such as cigarettes, eTammycigarettes, and chewing tobacco. If you need help quitting, ask your health care provider.  If you are sexually active, practice safe sex. Use a condom or other form of birth control (contraception) in order to prevent pregnancy and STIs (sexually  transmitted infections). If you plan to become pregnant, see your health care provider for a preconception visit. What's next?  Visit your health care provider once a year for a well check visit.  Ask your health care provider how often you should have your eyes and teeth checked.  Stay up to date on all vaccines. This information is not intended to replace advice given to you by your health care provider. Make sure you discuss any questions you have with your health care provider. Document Revised: 03/23/2018 Document Reviewed: 03/23/2018 Elsevier Patient Education  2020 [place], [place] [facility]

## 2020-05-21 NOTE — Addendum Note (Signed)
Addended by: Lerry Liner on: 05/21/2020 01:08 PM   Modules accepted: Orders

## 2020-05-21 NOTE — Addendum Note (Signed)
Addended by: Lerry Liner on: 05/21/2020 01:07 PM   Modules accepted: Orders

## 2020-05-23 LAB — URINALYSIS
Bilirubin Urine: NEGATIVE
Glucose, UA: NEGATIVE
Ketones, ur: NEGATIVE
Nitrite: POSITIVE — AB
Protein, ur: NEGATIVE
Specific Gravity, Urine: 1.009 (ref 1.001–1.03)
pH: 5.5 (ref 5.0–8.0)

## 2020-05-23 LAB — URINE CULTURE
MICRO NUMBER:: 11125810
SPECIMEN QUALITY:: ADEQUATE

## 2020-05-27 ENCOUNTER — Other Ambulatory Visit: Payer: Self-pay | Admitting: Internal Medicine

## 2020-05-27 DIAGNOSIS — N3000 Acute cystitis without hematuria: Secondary | ICD-10-CM

## 2020-05-27 MED ORDER — CIPROFLOXACIN HCL 500 MG PO TABS: 500.0000 mg | ORAL_TABLET | Freq: Two times a day (BID) | ORAL | 0 refills | Status: AC

## 2020-06-05 NOTE — Progress Notes (Signed)
Chief Complaint  Patient presents with  . Urinary Tract Infection    having white discharge no smell, lower abdominal pain, burning itching, noticed particles in urine    HPI: Tammy Ballard 29 y.o. come in for SDA  PCP NA  having  Dysuria white vaginal dc aband burring itching vaginal sx. reminiscent of yeast but never had one this bad.  In remote past  otc 3 d was not curative and had to get oral med.  Has some suprapubic discomfort in past few days  On last day  cipro   ucx  10 27 e coli r st and int amox  Initially given sxtds change to cipro    LMP  Oct 23  Was on dep in past currently  No contraception but planning on some  Has aappt gyn in near futuer  ROS: See pertinent positives and negatives per HPI.  Past Medical History:  Diagnosis Date  . Heart murmur   . Migraines   . UTI (urinary tract infection)     Family History  Problem Relation Age of Onset  . Hypertension Mother   . Rheum arthritis Mother   . Diabetes Father     Social History   Socioeconomic History  . Marital status: Married    Spouse name: Not on file  . Number of children: Not on file  . Years of education: Not on file  . Highest education level: Not on file  Occupational History  . Not on file  Tobacco Use  . Smoking status: Never Smoker  . Smokeless tobacco: Never Used  Vaping Use  . Vaping Use: Never used  Substance and Sexual Activity  . Alcohol use: Yes    Comment: occasional  . Drug use: Never  . Sexual activity: Yes    Birth control/protection: None  Other Topics Concern  . Not on file  Social History Narrative  . Not on file   Social Determinants of Health   Financial Resource Strain:   . Difficulty of Paying Living Expenses: Not on file  Food Insecurity:   . Worried About Programme researcher, broadcasting/film/video in the Last Year: Not on file  . Ran Out of Food in the Last Year: Not on file  Transportation Needs:   . Lack of Transportation (Medical): Not on file  . Lack of  Transportation (Non-Medical): Not on file  Physical Activity:   . Days of Exercise per Week: Not on file  . Minutes of Exercise per Session: Not on file  Stress:   . Feeling of Stress : Not on file  Social Connections:   . Frequency of Communication with Friends and Family: Not on file  . Frequency of Social Gatherings with Friends and Family: Not on file  . Attends Religious Services: Not on file  . Active Member of Clubs or Organizations: Not on file  . Attends Banker Meetings: Not on file  . Marital Status: Not on file    Outpatient Medications Prior to Visit  Medication Sig Dispense Refill  . Ciprofloxacin (CIPRO PO) Take by mouth.     No facility-administered medications prior to visit.     EXAM:  BP 102/60   Pulse 97   Temp 98.3 F (36.8 C) (Oral)   Ht 5\' 1"  (1.549 m)   Wt 129 lb 12.8 oz (58.9 kg)   SpO2 97%   BMI 24.53 kg/m   Body mass index is 24.53 kg/m.  GENERAL: vitals reviewed and listed above,  alert, oriented, appears well hydrated and in no acute distress HEENT: atraumatic, conjunctiva  clear, no obvious abnormalities on inspection of external nose and ears OP :masked  NECK: no obvious masses on inspection palpation   CV: HRRR, no clubbing cyanosis or  peripheral edema nl cap refill  MS: moves all extremities without noticeable focal  Abnormality abd no masses  Ext GY 2+ red no edema  Thick white cottage cheese dc  Too uncomfortable for speculum but aptima  Swab obtained  No ulcers lesions  Or adeniopathy  PSYCH: pleasant and cooperative, no obvious depression or anxiety Lab Results  Component Value Date   WBC 7.2 11/16/2019   HGB 13.1 11/16/2019   HCT 39.3 11/16/2019   PLT 255 11/16/2019   GLUCOSE 106 (H) 11/16/2019   NA 140 11/16/2019   K 3.8 11/16/2019   CL 104 11/16/2019   CREATININE 0.70 11/16/2019   BUN 8 11/16/2019   CO2 27 11/16/2019   BP Readings from Last 3 Encounters:  06/06/20 102/60  05/21/20 110/70  03/27/20  102/68  poct preg neg  Urinalysis    Component Value Date/Time   COLORURINE YELLOW 05/21/2020 1308   APPEARANCEUR CLEAR 05/21/2020 1308   LABSPEC 1.009 05/21/2020 1308   PHURINE 5.5 05/21/2020 1308   GLUCOSEU NEGATIVE 05/21/2020 1308   HGBUR 1+ (A) 05/21/2020 1308   BILIRUBINUR neg 05/21/2020 1158   KETONESUR NEGATIVE 05/21/2020 1308   PROTEINUR NEGATIVE 05/21/2020 1308   PROTEINUR Negative 05/21/2020 1158   UROBILINOGEN 0.2 05/21/2020 1158   NITRITE Negative 06/06/2020 0933   NITRITE POSITIVE (A) 05/21/2020 1308   NITRITE pos 05/21/2020 1158   LEUKOCYTESUR Small (1+) (A) 06/06/2020 0933   LEUKOCYTESUR 2+ (A) 05/21/2020 1308      ASSESSMENT AND PLAN:  Discussed the following assessment and plan:  Dysuria - Plan: POCT Urinalysis Dip Manual, Cervicovaginal ancillary only, POCT urine pregnancy  Vaginal burning - Plan: Cervicovaginal ancillary only  Acute cystitis without hematuria - just finishing cipro   Vaginal discharge - Plan: Cervicovaginal ancillary only Exam cw  moderately severe    Yeast vaginitis  Discs rx and suggest  Miconazole 3-7 days topical and diflucan   rx x 1   Fu with gyne or pcp if  persistent or progressive  Disc poss of prevention utis  Poss ic pro[hylaxis or other depending  -Patient advised to return or notify health care team  if  new concerns arise.  Patient Instructions  Exam is consistent with yeast  Sending on specimen to confirm  And rule out other causes.   Antibiotics can trigger a yeast infection.  Take diflucan one pill and  Add OTC miconazole  3-7 days vaginal  .  Fu with pcp and  Or  OBGYNE.  Urine neg pregnancy tests.  And slight amount of  Wbc that could be from the  Yeast infection   Vaginal Yeast Infection, Adult  Vaginal yeast infection is a condition that causes vaginal discharge as well as soreness, swelling, and redness (inflammation) of the vagina. This is a common condition. Some women get this infection  frequently. What are the causes? This condition is caused by a change in the normal balance of the yeast (candida) and bacteria that live in the vagina. This change causes an overgrowth of yeast, which causes the inflammation. What increases the risk? The condition is more likely to develop in women who:  Take antibiotic medicines.  Have diabetes.  Take birth control pills.  Are pregnant.  Douche  often.  Have a weak body defense system (immune system).  Have been taking steroid medicines for a long time.  Frequently wear tight clothing. What are the signs or symptoms? Symptoms of this condition include:  White, thick, creamy vaginal discharge.  Swelling, itching, redness, and irritation of the vagina. The lips of the vagina (vulva) may be affected as well.  Pain or a burning feeling while urinating.  Pain during sex. How is this diagnosed? This condition is diagnosed based on:  Your medical history.  A physical exam.  A pelvic exam. Your health care provider will examine a sample of your vaginal discharge under a microscope. Your health care provider may send this sample for testing to confirm the diagnosis. How is this treated? This condition is treated with medicine. Medicines may be over-the-counter or prescription. You may be told to use one or more of the following:  Medicine that is taken by mouth (orally).  Medicine that is applied as a cream (topically).  Medicine that is inserted directly into the vagina (suppository). Follow these instructions at home:  Lifestyle  Do not have sex until your health care provider approves. Tell your sex partner that you have a yeast infection. That person should go to his or her health care provider and ask if they should also be treated.  Do not wear tight clothes, such as pantyhose or tight pants.  Wear breathable cotton underwear. General instructions  Take or apply over-the-counter and prescription medicines only  as told by your health care provider.  Eat more yogurt. This may help to keep your yeast infection from returning.  Do not use tampons until your health care provider approves.  Try taking a sitz bath to help with discomfort. This is a warm water bath that is taken while you are sitting down. The water should only come up to your hips and should cover your buttocks. Do this 3-4 times per day or as told by your health care provider.  Do not douche.  If you have diabetes, keep your blood sugar levels under control.  Keep all follow-up visits as told by your health care provider. This is important. Contact a health care provider if:  You have a fever.  Your symptoms go away and then return.  Your symptoms do not get better with treatment.  Your symptoms get worse.  You have new symptoms.  You develop blisters in or around your vagina.  You have blood coming from your vagina and it is not your menstrual period.  You develop pain in your abdomen. Summary  Vaginal yeast infection is a condition that causes discharge as well as soreness, swelling, and redness (inflammation) of the vagina.  This condition is treated with medicine. Medicines may be over-the-counter or prescription.  Take or apply over-the-counter and prescription medicines only as told by your health care provider.  Do not douche. Do not have sex or use tampons until your health care provider approves.  Contact a health care provider if your symptoms do not get better with treatment or your symptoms go away and then return. This information is not intended to replace advice given to you by your health care provider. Make sure you discuss any questions you have with your health care provider. Document Revised: 02/09/2019 Document Reviewed: 11/28/2017 Elsevier Patient Education  2020 [employer].     [place] K. Juliauna Stueve M.D.

## 2020-06-06 ENCOUNTER — Ambulatory Visit (INDEPENDENT_AMBULATORY_CARE_PROVIDER_SITE_OTHER): Payer: PRIVATE HEALTH INSURANCE | Admitting: Internal Medicine

## 2020-06-06 ENCOUNTER — Other Ambulatory Visit: Payer: Self-pay

## 2020-06-06 ENCOUNTER — Encounter: Payer: Self-pay | Admitting: Internal Medicine

## 2020-06-06 ENCOUNTER — Other Ambulatory Visit (HOSPITAL_COMMUNITY)
Admission: RE | Admit: 2020-06-06 | Discharge: 2020-06-06 | Disposition: A | Discharge status: Home / Self Care | Payer: PRIVATE HEALTH INSURANCE | Source: Ambulatory Visit | Attending: Internal Medicine | Admitting: Internal Medicine

## 2020-06-06 VITALS — BP 102/60 | HR 97 | Temp 98.3°F | Ht 61.0 in | Wt 129.8 lb

## 2020-06-06 DIAGNOSIS — N3000 Acute cystitis without hematuria: Secondary | ICD-10-CM | POA: Diagnosis not present

## 2020-06-06 DIAGNOSIS — R300 Dysuria: Secondary | ICD-10-CM | POA: Insufficient documentation

## 2020-06-06 DIAGNOSIS — N949 Unspecified condition associated with female genital organs and menstrual cycle: Secondary | ICD-10-CM | POA: Diagnosis present

## 2020-06-06 DIAGNOSIS — N898 Other specified noninflammatory disorders of vagina: Secondary | ICD-10-CM

## 2020-06-06 LAB — POCT URINALYSIS DIPSTICK (MANUAL)
Nitrite, UA: NEGATIVE
Poct Bilirubin: NEGATIVE
Poct Blood: NEGATIVE
Poct Glucose: NORMAL mg/dL
Poct Ketones: NEGATIVE
Poct Protein: NEGATIVE mg/dL
Poct Urobilinogen: NORMAL mg/dL
Spec Grav, UA: <=1.005 — AB (ref 1.010–1.025)
pH, UA: 7 (ref 5.0–8.0)

## 2020-06-06 LAB — POCT URINE PREGNANCY: Preg Test, Ur: NEGATIVE

## 2020-06-06 MED ORDER — FLUCONAZOLE 150 MG PO TABS
150.0000 mg | ORAL_TABLET | Freq: Once | ORAL | 0 refills | Status: AC
Start: 2020-06-06 — End: 2020-06-06

## 2020-06-06 NOTE — Patient Instructions (Addendum)
Exam is consistent with yeast  Sending on specimen to confirm  And rule out other causes.   Antibiotics can trigger a yeast infection.  Take diflucan one pill and  Add OTC miconazole  3-7 days vaginal  .  Fu with pcp and  Or  OBGYNE.  Urine neg pregnancy tests.  And slight amount of  Wbc that could be from the  Yeast infection   Vaginal Yeast Infection, Adult  Vaginal yeast infection is a condition that causes vaginal discharge as well as soreness, swelling, and redness (inflammation) of the vagina. This is a common condition. Some women get this infection frequently. What are the causes? This condition is caused by a change in the normal balance of the yeast (candida) and bacteria that live in the vagina. This change causes an overgrowth of yeast, which causes the inflammation. What increases the risk? The condition is more likely to develop in women who:  Take antibiotic medicines.  Have diabetes.  Take birth control pills.  Are pregnant.  Douche often.  Have a weak body defense system (immune system).  Have been taking steroid medicines for a long time.  Frequently wear tight clothing. What are the signs or symptoms? Symptoms of this condition include:  White, thick, creamy vaginal discharge.  Swelling, itching, redness, and irritation of the vagina. The lips of the vagina (vulva) may be affected as well.  Pain or a burning feeling while urinating.  Pain during sex. How is this diagnosed? This condition is diagnosed based on:  Your medical history.  A physical exam.  A pelvic exam. Your health care provider will examine a sample of your vaginal discharge under a microscope. Your health care provider may send this sample for testing to confirm the diagnosis. How is this treated? This condition is treated with medicine. Medicines may be over-the-counter or prescription. You may be told to use one or more of the following:  Medicine that is taken by mouth  (orally).  Medicine that is applied as a cream (topically).  Medicine that is inserted directly into the vagina (suppository). Follow these instructions at home:  Lifestyle  Do not have sex until your health care provider approves. Tell your sex partner that you have a yeast infection. That person should go to his or her health care provider and ask if they should also be treated.  Do not wear tight clothes, such as pantyhose or tight pants.  Wear breathable cotton underwear. General instructions  Take or apply over-the-counter and prescription medicines only as told by your health care provider.  Eat more yogurt. This may help to keep your yeast infection from returning.  Do not use tampons until your health care provider approves.  Try taking a sitz bath to help with discomfort. This is a warm water bath that is taken while you are sitting down. The water should only come up to your hips and should cover your buttocks. Do this 3-4 times per day or as told by your health care provider.  Do not douche.  If you have diabetes, keep your blood sugar levels under control.  Keep all follow-up visits as told by your health care provider. This is important. Contact a health care provider if:  You have a fever.  Your symptoms go away and then return.  Your symptoms do not get better with treatment.  Your symptoms get worse.  You have new symptoms.  You develop blisters in or around your vagina.  You have blood coming  from your vagina and it is not your menstrual period.  You develop pain in your abdomen. Summary  Vaginal yeast infection is a condition that causes discharge as well as soreness, swelling, and redness (inflammation) of the vagina.  This condition is treated with medicine. Medicines may be over-the-counter or prescription.  Take or apply over-the-counter and prescription medicines only as told by your health care provider.  Do not douche. Do not have sex or  use tampons until your health care provider approves.  Contact a health care provider if your symptoms do not get better with treatment or your symptoms go away and then return. This information is not intended to replace advice given to you by your health care provider. Make sure you discuss any questions you have with your health care provider. Document Revised: 02/09/2019 Document Reviewed: 11/28/2017 Elsevier Patient Education  2020 [employer].

## 2020-06-08 ENCOUNTER — Encounter: Payer: Self-pay | Admitting: Internal Medicine

## 2020-06-09 LAB — CERVICOVAGINAL ANCILLARY ONLY
Bacterial Vaginitis (gardnerella): POSITIVE — AB
Candida Glabrata: NEGATIVE
Candida Vaginitis: POSITIVE — AB
Chlamydia: NEGATIVE
Comment: NEGATIVE
Comment: NEGATIVE
Comment: NEGATIVE
Comment: NEGATIVE
Comment: NEGATIVE
Comment: NORMAL
Neisseria Gonorrhea: NEGATIVE
Trichomonas: NEGATIVE

## 2020-06-09 NOTE — Progress Notes (Signed)
Test positive for yeast  and  BV .  So you have 2 reasons for the vaginitis   You have been treated for yeast with the diflucan   Please send in  metronidazole 500 mg 1 po bid for 7 days for the BV rx .   Fu with gyne or pcp if  problems

## 2020-06-10 ENCOUNTER — Other Ambulatory Visit: Payer: Self-pay

## 2020-06-10 MED ORDER — FLUCONAZOLE 150 MG PO TABS
150.0000 mg | ORAL_TABLET | Freq: Once | ORAL | 0 refills | Status: AC
Start: 2020-06-10 — End: 2020-06-10

## 2020-06-10 MED ORDER — METRONIDAZOLE 500 MG PO TABS: 500.0000 mg | ORAL_TABLET | Freq: Two times a day (BID) | ORAL | 0 refills | Status: DC

## 2020-07-10 ENCOUNTER — Ambulatory Visit: Payer: PRIVATE HEALTH INSURANCE | Admitting: Nurse Practitioner

## 2020-08-14 ENCOUNTER — Encounter: Payer: Self-pay | Admitting: Internal Medicine

## 2020-08-14 ENCOUNTER — Ambulatory Visit: Payer: PRIVATE HEALTH INSURANCE | Admitting: Nurse Practitioner

## 2020-08-14 ENCOUNTER — Ambulatory Visit (INDEPENDENT_AMBULATORY_CARE_PROVIDER_SITE_OTHER): Payer: PRIVATE HEALTH INSURANCE | Admitting: Internal Medicine

## 2020-08-14 ENCOUNTER — Other Ambulatory Visit: Payer: Self-pay

## 2020-08-14 VITALS — BP 94/60 | HR 89 | Temp 98.0°F | Wt 127.2 lb

## 2020-08-14 DIAGNOSIS — R202 Paresthesia of skin: Secondary | ICD-10-CM

## 2020-08-14 DIAGNOSIS — G44209 Tension-type headache, unspecified, not intractable: Secondary | ICD-10-CM | POA: Diagnosis not present

## 2020-08-14 LAB — VITAMIN B12: Vitamin B-12: 490 pg/mL (ref 211–911)

## 2020-08-14 LAB — TSH: TSH: 1.55 u[IU]/mL (ref 0.35–4.50)

## 2020-08-14 LAB — VITAMIN D 25 HYDROXY (VIT D DEFICIENCY, FRACTURES): VITD: 22.61 ng/mL — ABNORMAL LOW (ref 30.00–100.00)

## 2020-08-14 NOTE — Patient Instructions (Signed)
-  Nice seeing you today!!  -Lab work today; will notify you once results are available.   

## 2020-08-14 NOTE — Progress Notes (Signed)
Established Patient Office Visit     This visit occurred during the SARS-CoV-2 public health emergency.  Safety protocols were in place, including screening questions prior to the visit, additional usage of staff PPE, and extensive cleaning of exam room while observing appropriate contact time as indicated for disinfecting solutions.    CC/Reason for Visit: Headache, neck pain  HPI: Tammy Ballard is a 30 y.o. female who is coming in today for the above mentioned reasons.  She has had a headache now for few months.  She notices that pain will start at her neck and shoulder area and then progressed to the back part of her neck.  Her head feels stiff at times.  She is getting about 2 massages a month.  She has tried heat, she does occasionally use OTC pain medication but not with any consistency.  She has had meningitis in the past and is concerned about this.  She denies fever, photophobia, confusion, vision changes.  She has not had an eye exam in a long time.  With her job she is constantly on the computer, she does not wear blue blocking lenses.  She occasionally has noticed tingling in her feet.   Past Medical/Surgical History: Past Medical History:  Diagnosis Date  . Heart murmur   . Migraines   . UTI (urinary tract infection)     Past Surgical History:  Procedure Laterality Date  . CESAREAN SECTION     x2    Social History:  reports that she has never smoked. She has never used smokeless tobacco. She reports current alcohol use. She reports that she does not use drugs.  Allergies: No Known Allergies  Family History:  Family History  Problem Relation Age of Onset  . Hypertension Mother   . Rheum arthritis Mother   . Diabetes Father      Current Outpatient Medications:  .  Ciprofloxacin (CIPRO PO), Take by mouth. (Patient not taking: Reported on 08/14/2020), Disp: , Rfl:   Review of Systems:  Constitutional: Denies fever, chills, diaphoresis, appetite change  and fatigue.  HEENT: Denies photophobia, eye pain, redness, hearing loss, ear pain, congestion, sore throat, rhinorrhea, sneezing, mouth sores, trouble swallowing,neck stiffness and tinnitus.   Respiratory: Denies SOB, DOE, cough, chest tightness,  and wheezing.   Cardiovascular: Denies chest pain, palpitations and leg swelling.  Gastrointestinal: Denies nausea, vomiting, abdominal pain, diarrhea, constipation, blood in stool and abdominal distention.  Genitourinary: Denies dysuria, urgency, frequency, hematuria, flank pain and difficulty urinating.  Endocrine: Denies: hot or cold intolerance, sweats, changes in hair or nails, polyuria, polydipsia. Musculoskeletal: Denies myalgias, back pain, joint swelling, arthralgias and gait problem.  Skin: Denies pallor, rash and wound.  Neurological: Denies dizziness, seizures, syncope, weakness, light-headedness and headaches.  Hematological: Denies adenopathy. Easy bruising, personal or family bleeding history  Psychiatric/Behavioral: Denies suicidal ideation, mood changes, confusion, nervousness, sleep disturbance and agitation    Physical Exam: Vitals:   08/14/20 1359  BP: 94/60  Pulse: 89  Temp: 98 F (36.7 C)  TempSrc: Oral  SpO2: (!) 89%  Weight: 127 lb 3.2 oz (57.7 kg)    Body mass index is 24.03 kg/m.   Constitutional: NAD, calm, comfortable Eyes: PERRL, lids and conjunctivae normal ENMT: Mucous membranes are moist.  Neck: normal, supple, no masses, no thyromegaly Respiratory: clear to auscultation bilaterally, no wheezing, no crackles. Normal respiratory effort. No accessory muscle use.  Cardiovascular: Regular rate and rhythm, no murmurs / rubs / gallops. No extremity edema.  Neurologic: Grossly intact and nonfocal. Psychiatric: Normal judgment and insight. Alert and oriented x 3. Normal mood.    Impression and Plan:  Tension headache Tingling in extremities  - Plan: TSH, Vitamin B12, VITAMIN D 25 Hydroxy (Vit-D  Deficiency, Fractures) -Advised rotating Tylenol with ibuprofen, ice packs, eye exam and blue blocking lenses, correct posture, continue local massage therapy.  She will reach out to me if any further issues.   Patient Instructions  -Nice seeing you today!!  -Lab work today; will notify you once results are available.       Chaya Jan, MD [facility]

## 2020-08-15 ENCOUNTER — Encounter: Payer: Self-pay | Admitting: Internal Medicine

## 2020-08-15 ENCOUNTER — Other Ambulatory Visit: Payer: Self-pay | Admitting: Internal Medicine

## 2020-08-15 DIAGNOSIS — E559 Vitamin D deficiency, unspecified: Secondary | ICD-10-CM | POA: Insufficient documentation

## 2020-08-15 MED ORDER — VITAMIN D (ERGOCALCIFEROL) 1.25 MG (50000 UNIT) PO CAPS: 50000.0000 [IU] | ORAL_CAPSULE | ORAL | 0 refills | Status: DC

## 2020-10-08 ENCOUNTER — Encounter: Payer: Self-pay | Admitting: Internal Medicine

## 2020-10-08 ENCOUNTER — Other Ambulatory Visit: Payer: Self-pay

## 2020-10-08 ENCOUNTER — Ambulatory Visit (INDEPENDENT_AMBULATORY_CARE_PROVIDER_SITE_OTHER): Payer: PRIVATE HEALTH INSURANCE | Admitting: Internal Medicine

## 2020-10-08 VITALS — BP 100/60 | HR 87 | Temp 98.2°F | Wt 128.2 lb

## 2020-10-08 DIAGNOSIS — R1011 Right upper quadrant pain: Secondary | ICD-10-CM | POA: Diagnosis not present

## 2020-10-08 DIAGNOSIS — R17 Unspecified jaundice: Secondary | ICD-10-CM | POA: Diagnosis not present

## 2020-10-08 LAB — COMPREHENSIVE METABOLIC PANEL
ALT: 9 U/L (ref 0–35)
AST: 16 U/L (ref 0–37)
Albumin: 4.6 g/dL (ref 3.5–5.2)
Alkaline Phosphatase: 40 U/L (ref 39–117)
BUN: 8 mg/dL (ref 6–23)
CO2: 26 mEq/L (ref 19–32)
Calcium: 9.8 mg/dL (ref 8.4–10.5)
Chloride: 102 mEq/L (ref 96–112)
Creatinine, Ser: 0.79 mg/dL (ref 0.40–1.20)
GFR: 99.39 mL/min (ref >60.00)
Glucose, Bld: 84 mg/dL (ref 70–99)
Potassium: 4.4 mEq/L (ref 3.5–5.1)
Sodium: 136 mEq/L (ref 135–145)
Total Bilirubin: 1.7 mg/dL — ABNORMAL HIGH (ref 0.2–1.2)
Total Protein: 7.8 g/dL (ref 6.0–8.3)

## 2020-10-08 LAB — CBC WITH DIFFERENTIAL/PLATELET
Basophils Absolute: 0 10*3/uL (ref 0.0–0.1)
Basophils Relative: 0.4 % (ref 0.0–3.0)
Eosinophils Absolute: 0 10*3/uL (ref 0.0–0.7)
Eosinophils Relative: 0.9 % (ref 0.0–5.0)
HCT: 36.1 % (ref 36.0–46.0)
Hemoglobin: 12.3 g/dL (ref 12.0–15.0)
Lymphocytes Relative: 40.9 % (ref 12.0–46.0)
Lymphs Abs: 2.3 10*3/uL (ref 0.7–4.0)
MCHC: 34.2 g/dL (ref 30.0–36.0)
MCV: 89.6 fl (ref 78.0–100.0)
Monocytes Absolute: 0.3 10*3/uL (ref 0.1–1.0)
Monocytes Relative: 4.8 % (ref 3.0–12.0)
Neutro Abs: 3 10*3/uL (ref 1.4–7.7)
Neutrophils Relative %: 53 % (ref 43.0–77.0)
Platelets: 264 10*3/uL (ref 150.0–400.0)
RBC: 4.03 Mil/uL (ref 3.87–5.11)
RDW: 13.3 % (ref 11.5–15.5)
WBC: 5.6 10*3/uL (ref 4.0–10.5)

## 2020-10-08 NOTE — Progress Notes (Signed)
     Established Patient Office Visit     This visit occurred during the SARS-CoV-2 public health emergency.  Safety protocols were in place, including screening questions prior to the visit, additional usage of staff PPE, and extensive cleaning of exam room while observing appropriate contact time as indicated for disinfecting solutions.    CC/Reason for Visit: RUQ pain  HPI: Tammy Ballard is a 30 y.o. female who is coming in today for the above mentioned reasons. For the past 2 weeks she has had a pattern of increasing RUQ pain. Now hurts to take a deep breath, gets worse with fatty foods. She has had nausea but no emesis, no change in bowel habits. She feels her eyes are turning a little yellow.  Past Medical/Surgical History: Past Medical History:  Diagnosis Date  . Heart murmur   . Migraines   . UTI (urinary tract infection)     Past Surgical History:  Procedure Laterality Date  . CESAREAN SECTION     x2    Social History:  reports that she has never smoked. She has never used smokeless tobacco. She reports current alcohol use. She reports that she does not use drugs.  Allergies: No Known Allergies  Family History:  Family History  Problem Relation Age of Onset  . Hypertension Mother   . Rheum arthritis Mother   . Diabetes Father      Current Outpatient Medications:  Marland Kitchen  Multiple Vitamins-Calcium (ONE-A-DAY WOMENS FORMULA PO), Take by mouth., Disp: , Rfl:   Review of Systems:  Constitutional: Denies fever, chills, diaphoresis, appetite change and fatigue.  HEENT: Denies photophobia, eye pain, redness, hearing loss, ear pain, congestion, sore throat, rhinorrhea, sneezing, mouth sores, trouble swallowing, neck pain, neck stiffness and tinnitus.   Respiratory: Denies SOB, DOE, cough, chest tightness,  and wheezing.   Cardiovascular: Denies chest pain, palpitations and leg swelling.  Gastrointestinal: Denies  vomiting,  diarrhea, constipation, blood in stool  and abdominal distention.  Genitourinary: Denies dysuria, urgency, frequency, hematuria, flank pain and difficulty urinating.  Endocrine: Denies: hot or cold intolerance, sweats, changes in hair or nails, polyuria, polydipsia. Musculoskeletal: Denies myalgias, back pain, joint swelling, arthralgias and gait problem.  Skin: Denies pallor, rash and wound.  Neurological: Denies dizziness, seizures, syncope, weakness, light-headedness, numbness and headaches.  Hematological: Denies adenopathy. Easy bruising, personal or family bleeding history  Psychiatric/Behavioral: Denies suicidal ideation, mood changes, confusion, nervousness, sleep disturbance and agitation    Physical Exam: Vitals:   10/08/20 1120  BP: 100/60  Pulse: 87  Temp: 98.2 F (36.8 C)  TempSrc: Oral  SpO2: 97%  Weight: 128 lb 3.2 oz (58.2 kg)    Body mass index is 24.22 kg/m.   Constitutional: NAD, calm, comfortable Eyes: PERRL, slight conjunctival icterus  ENMT: Mucous membranes are moist. Respiratory: clear to auscultation bilaterally, no wheezing, no crackles.  Abdomen: exquisite tenderness to deep palpation of the RUQ. +Murphy's sign.  Bowel sounds positive.  Neurologic: grossly intact and non-focal.  Psychiatric: Normal judgment and insight. Alert and oriented x 3. Normal mood.    Impression and Plan:  RUQ pain  Yellow eyes   -Concerned about GB pathology, mainly acute cholecystitis and possible cholelithiasis with her icterus. -CBC/CMET and RUQ pain all ordered urgently. -Further recommendations to follow.    Chaya Jan, MD  [facility] at [facility]

## 2020-12-25 ENCOUNTER — Inpatient Hospital Stay: Admission: RE | Admit: 2020-12-25 | Payer: PRIVATE HEALTH INSURANCE | Source: Ambulatory Visit

## 2021-01-09 ENCOUNTER — Ambulatory Visit
Admission: RE | Admit: 2021-01-09 | Discharge: 2021-01-09 | Disposition: A | Discharge status: Home / Self Care | Payer: PRIVATE HEALTH INSURANCE | Source: Ambulatory Visit | Attending: Internal Medicine | Admitting: Internal Medicine

## 2021-01-09 DIAGNOSIS — R17 Unspecified jaundice: Secondary | ICD-10-CM

## 2021-01-09 DIAGNOSIS — R1011 Right upper quadrant pain: Secondary | ICD-10-CM

## 2021-01-29 ENCOUNTER — Encounter: Payer: PRIVATE HEALTH INSURANCE | Admitting: Nurse Practitioner

## 2021-01-29 DIAGNOSIS — Z0289 Encounter for other administrative examinations: Secondary | ICD-10-CM

## 2021-02-24 ENCOUNTER — Telehealth (INDEPENDENT_AMBULATORY_CARE_PROVIDER_SITE_OTHER): Payer: PRIVATE HEALTH INSURANCE | Admitting: Internal Medicine

## 2021-02-24 ENCOUNTER — Encounter: Payer: Self-pay | Admitting: Internal Medicine

## 2021-02-24 VITALS — Wt 128.0 lb

## 2021-02-24 DIAGNOSIS — Z20822 Contact with and (suspected) exposure to covid-19: Secondary | ICD-10-CM | POA: Diagnosis not present

## 2021-02-24 DIAGNOSIS — R519 Headache, unspecified: Secondary | ICD-10-CM

## 2021-02-24 NOTE — Progress Notes (Signed)
Virtual Visit via Video Note  I connected with Tammy Ballard on 02/24/21 at  4:00 PM EDT by a video enabled telemedicine application and verified that I am speaking with the correct person using two identifiers.  Location patient: home Location provider: work office Persons participating in the virtual visit: patient, provider  I discussed the limitations of evaluation and management by telemedicine and the availability of in person appointments. The patient expressed understanding and agreed to proceed.   HPI: She has scheduled this visit to discuss headaches.  She first had a headache about 6 days ago and took some Tylenol which helped but only temporarily.  She also feels like she might have some nasal congestion.  No recent travel or sick contacts she states she took her mother for COVID last week.   ROS: Constitutional: Denies fever, chills, diaphoresis, appetite change and fatigue.  HEENT: Denies photophobia, eye pain, redness, hearing loss, ear pain, congestion, sore throat, rhinorrhea, sneezing, mouth sores, trouble swallowing, neck pain, neck stiffness and tinnitus.   Respiratory: Denies SOB, DOE, cough, chest tightness,  and wheezing.   Cardiovascular: Denies chest pain, palpitations and leg swelling.  Gastrointestinal: Denies nausea, vomiting, abdominal pain, diarrhea, constipation, blood in stool and abdominal distention.  Genitourinary: Denies dysuria, urgency, frequency, hematuria, flank pain and difficulty urinating.  Endocrine: Denies: hot or cold intolerance, sweats, changes in hair or nails, polyuria, polydipsia. Musculoskeletal: Denies myalgias, back pain, joint swelling, arthralgias and gait problem.  Skin: Denies pallor, rash and wound.  Neurological: Denies dizziness, seizures, syncope, weakness, light-headedness, numbness. Hematological: Denies adenopathy. Easy bruising, personal or family bleeding history  Psychiatric/Behavioral: Denies suicidal ideation,  mood changes, confusion, nervousness, sleep disturbance and agitation   Past Medical History:  Diagnosis Date   Heart murmur    Migraines    UTI (urinary tract infection)     Past Surgical History:  Procedure Laterality Date   CESAREAN SECTION     x2    Family History  Problem Relation Age of Onset   Hypertension Mother    Rheum arthritis Mother    Diabetes Father     SOCIAL HX:   reports that she has never smoked. She has never used smokeless tobacco. She reports current alcohol use. She reports that she does not use drugs.   Current Outpatient Medications:    Multiple Vitamins-Calcium (ONE-A-DAY WOMENS FORMULA PO), Take by mouth., Disp: , Rfl:   EXAM:   VITALS per patient if applicable: None reported  GENERAL: alert, oriented, appears well and in no acute distress  HEENT: atraumatic, conjunttiva clear, no obvious abnormalities on inspection of external nose and ears  NECK: normal movements of the head and neck  LUNGS: on inspection no signs of respiratory distress, breathing rate appears normal, no obvious gross increased work of breathing, gasping or wheezing  CV: no obvious cyanosis  MS: moves all visible extremities without noticeable abnormality  PSYCH/NEURO: pleasant and cooperative, no obvious depression or anxiety, speech and thought processing grossly intact  ASSESSMENT AND PLAN:   Suspected COVID-19 virus infection Sinus headache  -Headache sounds like a sinus headache.  Have advised a regimen of OTC pain relievers, decongestants, antihistamines. -I have also urged her to retake another COVID test as it is possible that she might have taken it too early in the course of her illness for it to become positive. -She will notify me if either headache persists or COVID test turns positive.     I discussed the assessment and treatment  plan with the patient. The patient was provided an opportunity to ask questions and all were answered. The patient  agreed with the plan and demonstrated an understanding of the instructions.   The patient was advised to call back or seek an in-person evaluation if the symptoms worsen or if the condition fails to improve as anticipated.    Chaya Jan, MD  [facility] at [facility]

## 2021-04-07 IMAGING — CT CT RENAL STONE PROTOCOL
1 of 2 series · 14 of 32 positions shown, 19 images · non-contrast
Comparison: None.

CLINICAL DATA: Bilateral flank pain with hematuria

EXAM:
CT ABDOMEN AND PELVIS WITHOUT CONTRAST
TECHNIQUE: Multidetector CT imaging of the abdomen and pelvis was performed
following the standard protocol without oral or IV contrast.

[Series 2: renal standard/full · axial · 0.71mm/px · z∈[+472,+832]mm · 14 of 81 slices shown, 19 images]
[im 5/81  soft-tissue]
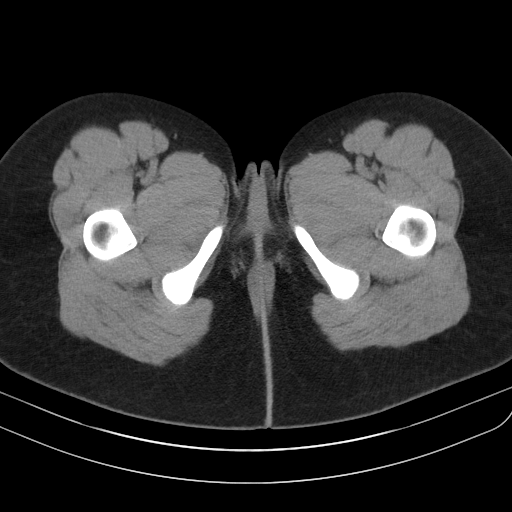
[im 5/81  bone]
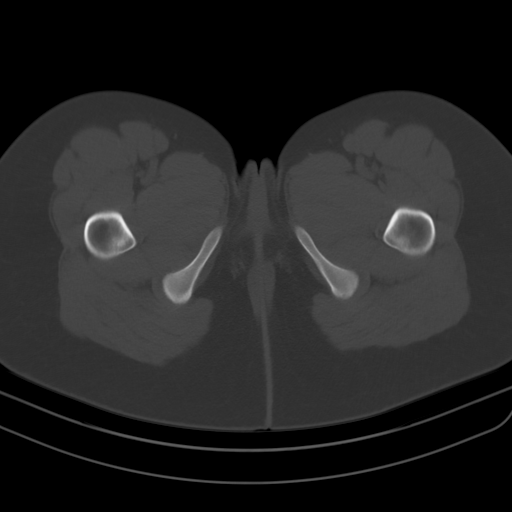
[im 13/81  soft-tissue]
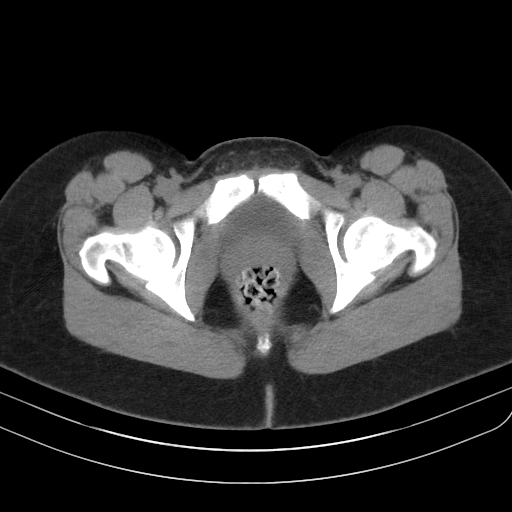
[im 17/81  soft-tissue]
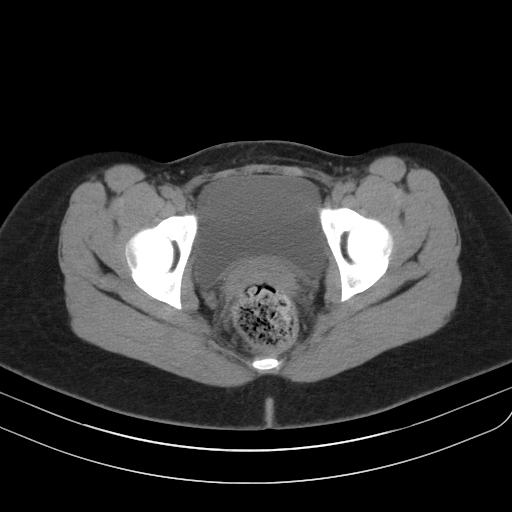
[im 25/81  soft-tissue]
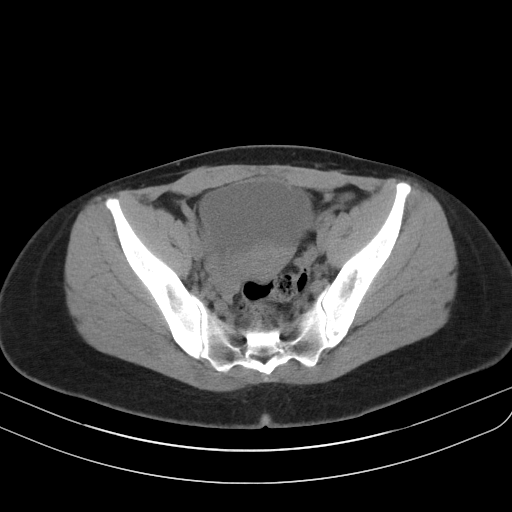
[im 29/81  soft-tissue]
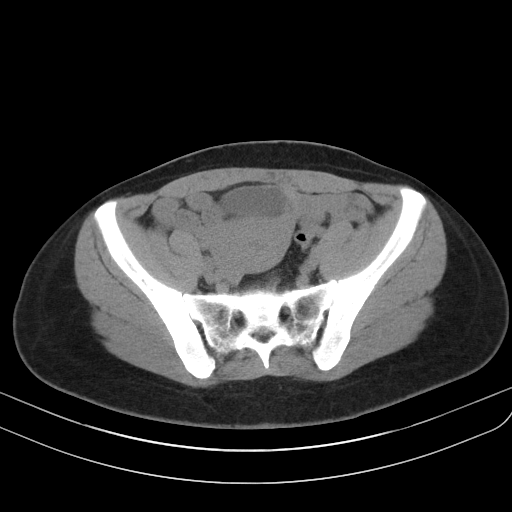
[im 37/81  soft-tissue]
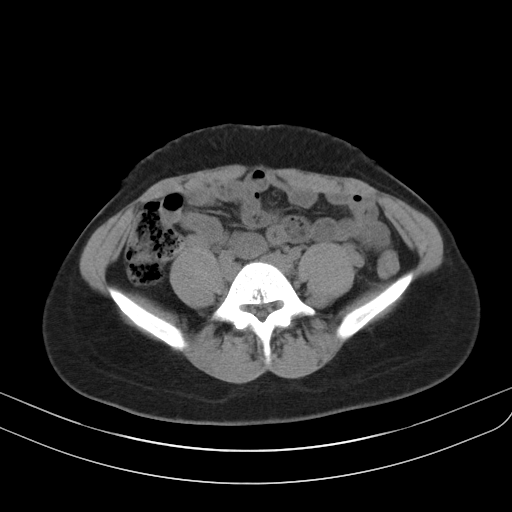
[im 41/81  soft-tissue]
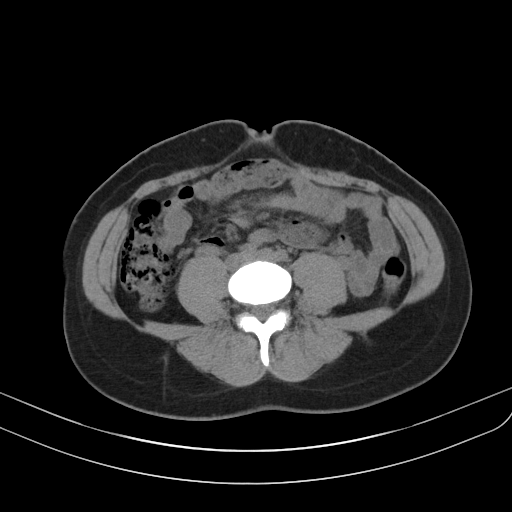
[im 45/81  soft-tissue]
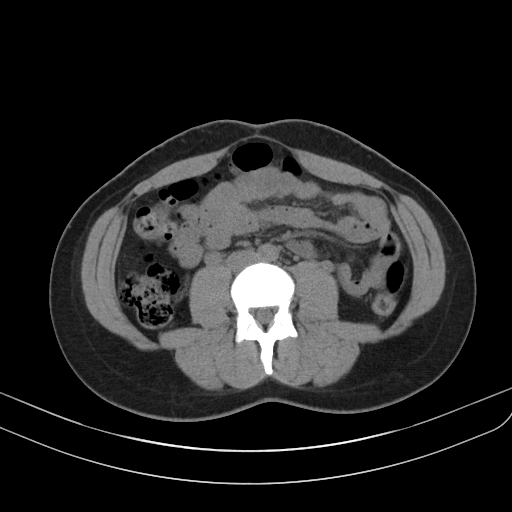
[im 53/81  soft-tissue]
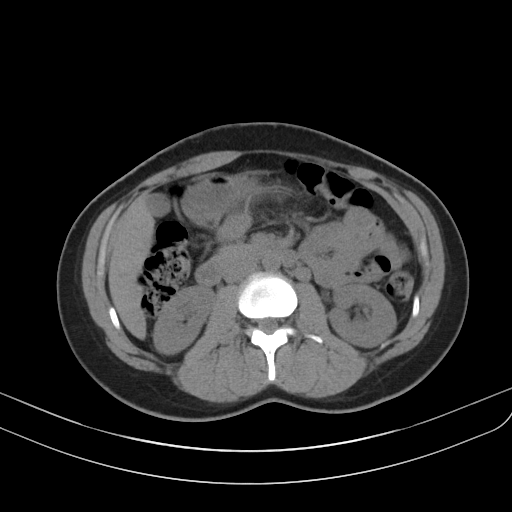
[im 53/81  bone]
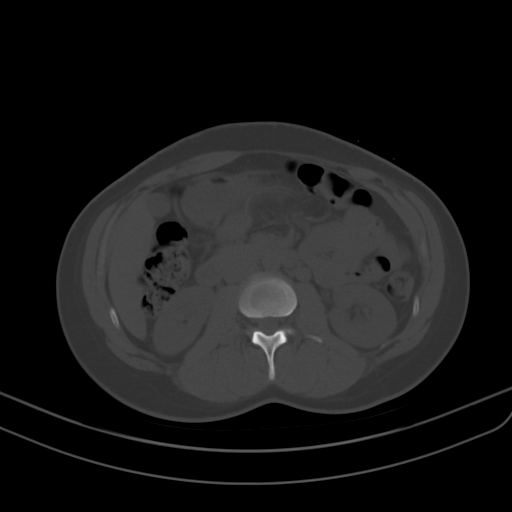
[im 57/81  soft-tissue]
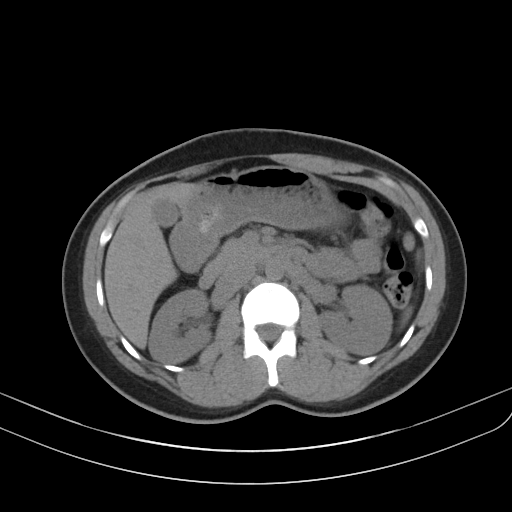
[im 65/81  soft-tissue]
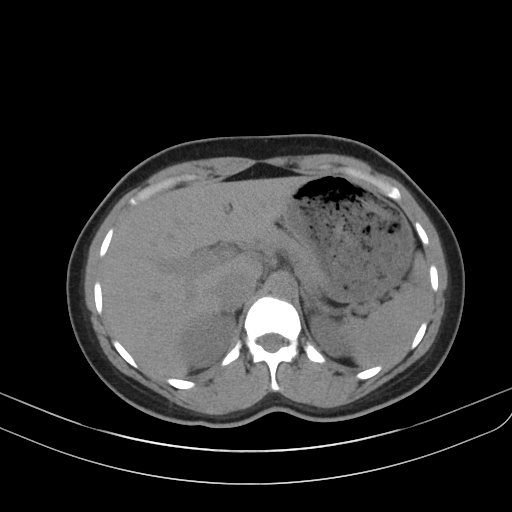
[im 65/81  lung]
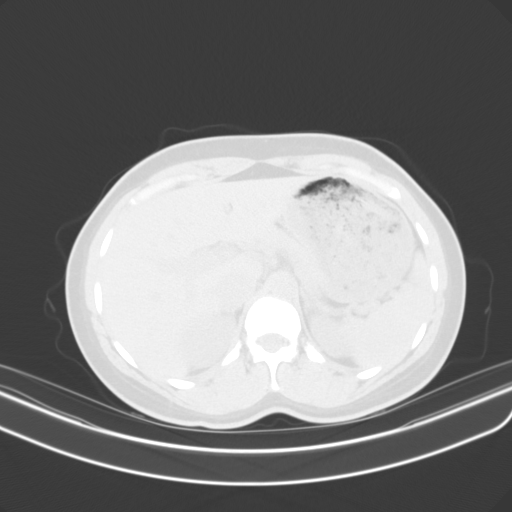
[im 69/81  soft-tissue]
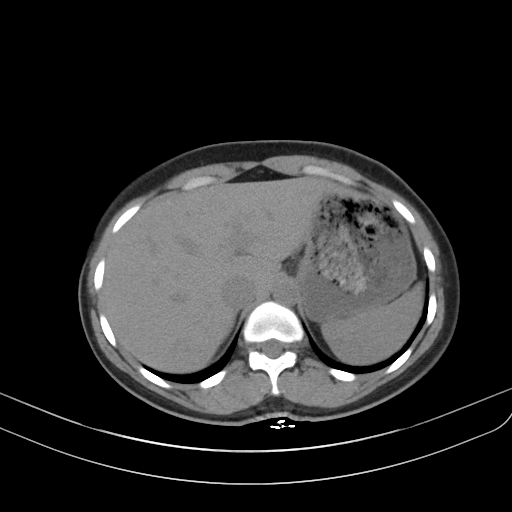
[im 69/81  lung]
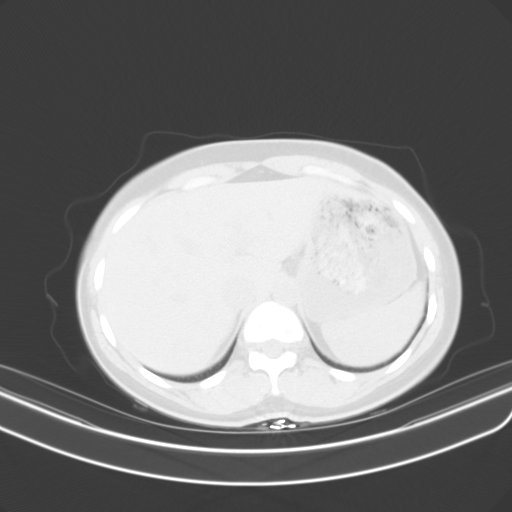
[im 73/81  lung]
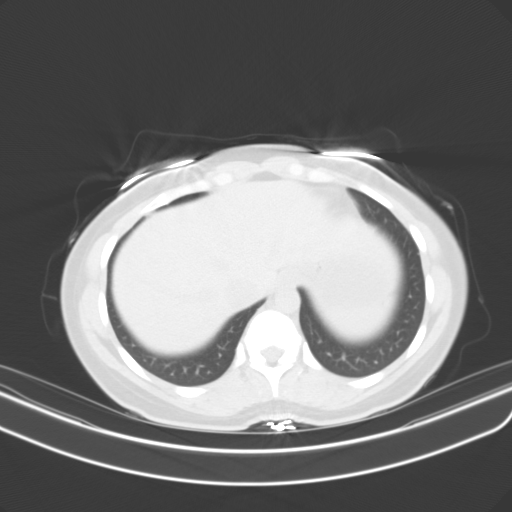
[im 77/81  soft-tissue]
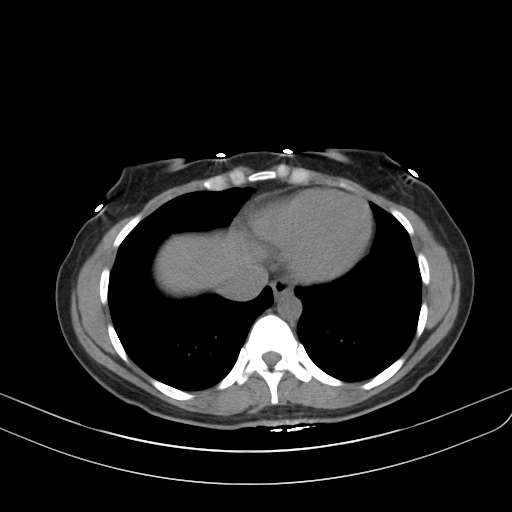
[im 77/81  lung]
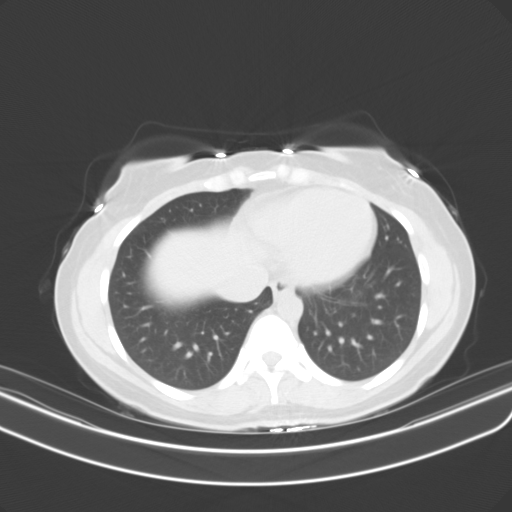

[14 of 32 positions shown; findings below may reference images not displayed]

FINDINGS: Lower chest: Lung bases are clear.

Hepatobiliary: No focal liver lesions are appreciable on this
noncontrast enhanced study. The gallbladder wall is not appreciably
thickened. There is no biliary duct dilatation.

Pancreas: There is no appreciable pancreatic mass or inflammatory
focus.

Spleen: No splenic lesions are evident.

Adrenals/Urinary Tract: Adrenals bilaterally appear normal. Kidneys
bilaterally show no evident mass or hydronephrosis on either side.
There is no appreciable renal or ureteral calculus on either side.
Urinary bladder is midline with wall thickness within normal limits.

Stomach/Bowel: There is no appreciable bowel wall or mesenteric
thickening. There is no evident bowel obstruction. The terminal
ileum appears unremarkable. There is no demonstrable free air or
portal venous air.

Vascular/Lymphatic: No abdominal aortic aneurysm. No vascular
lesions are evident on this noncontrast enhanced study.

Reproductive: The uterus is anteverted.  No evident pelvic mass.

Other: Appendix appears unremarkable. No abscess or ascites is
appreciable in the abdomen or pelvis. There is midline rectus muscle
thinning without evidence of bowel compromise in this area.

Musculoskeletal: No blastic or lytic bone lesions. There is no
intramuscular or abdominal wall lesion.
IMPRESSION: 1. A cause for hematuria and flank pain not established with this
study. No evident renal or ureteral calculus. No hydronephrosis on
either side. Urinary bladder wall thickness is within normal limits.

2.  Midline rectus muscle thinning without evident bowel compromise.

3. No bowel wall thickening or bowel obstruction evident. No abscess
in the abdomen or pelvis. Appendix appears normal.

## 2021-05-13 ENCOUNTER — Other Ambulatory Visit: Payer: Self-pay

## 2021-05-14 ENCOUNTER — Ambulatory Visit: Payer: PRIVATE HEALTH INSURANCE | Admitting: Internal Medicine

## 2021-05-18 ENCOUNTER — Ambulatory Visit: Payer: PRIVATE HEALTH INSURANCE | Admitting: Family Medicine

## 2021-06-17 ENCOUNTER — Ambulatory Visit: Payer: PRIVATE HEALTH INSURANCE | Admitting: Family Medicine

## 2021-12-15 ENCOUNTER — Ambulatory Visit: Payer: PRIVATE HEALTH INSURANCE | Admitting: Internal Medicine

## 2021-12-24 ENCOUNTER — Ambulatory Visit (INDEPENDENT_AMBULATORY_CARE_PROVIDER_SITE_OTHER): Payer: PRIVATE HEALTH INSURANCE | Admitting: Internal Medicine

## 2021-12-24 ENCOUNTER — Encounter: Payer: Self-pay | Admitting: Internal Medicine

## 2021-12-24 VITALS — BP 90/60 | HR 93 | Temp 98.5°F | Ht 61.0 in | Wt 132.4 lb

## 2021-12-24 DIAGNOSIS — K219 Gastro-esophageal reflux disease without esophagitis: Secondary | ICD-10-CM

## 2021-12-24 IMAGING — US US ABDOMEN LIMITED
1 series · 14 of 25 positions shown · non-contrast
Comparison: None.

CLINICAL DATA: Right upper quadrant pain and jaundice x2 weeks.

EXAM:
ULTRASOUND ABDOMEN LIMITED RIGHT UPPER QUADRANT

[Series 1: us abdomen limited · 0.19mm/px · 14 of 67 slices shown]
[im 1/67]
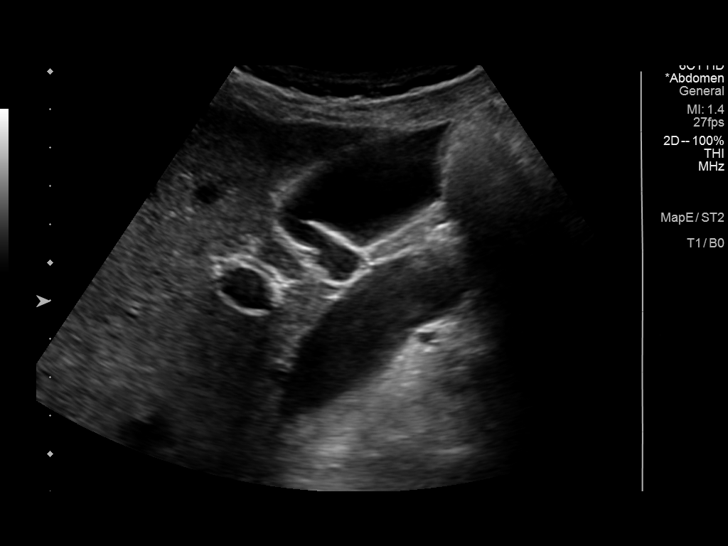
[im 6/67]
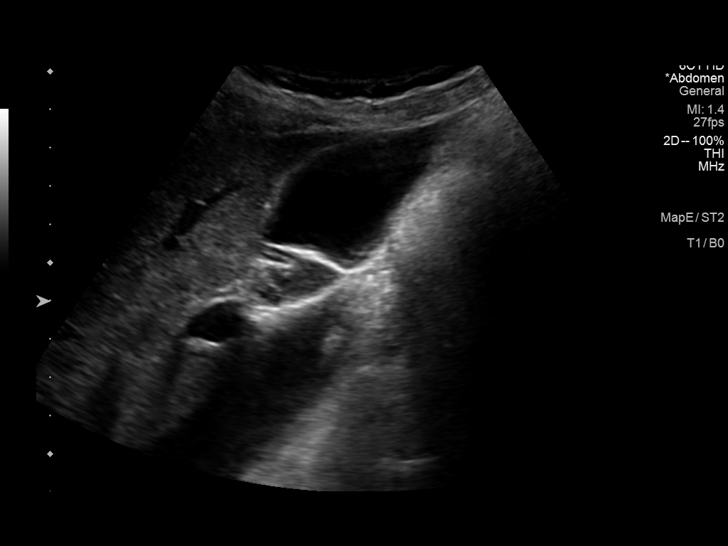
[im 12/67]
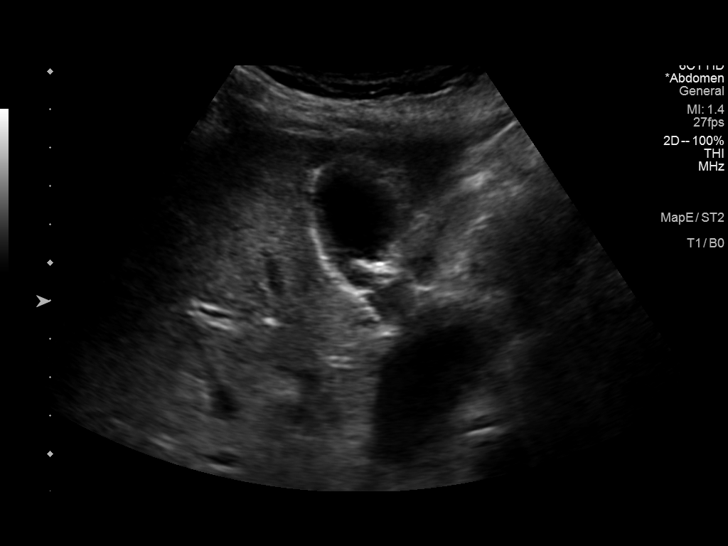
[im 17/67]
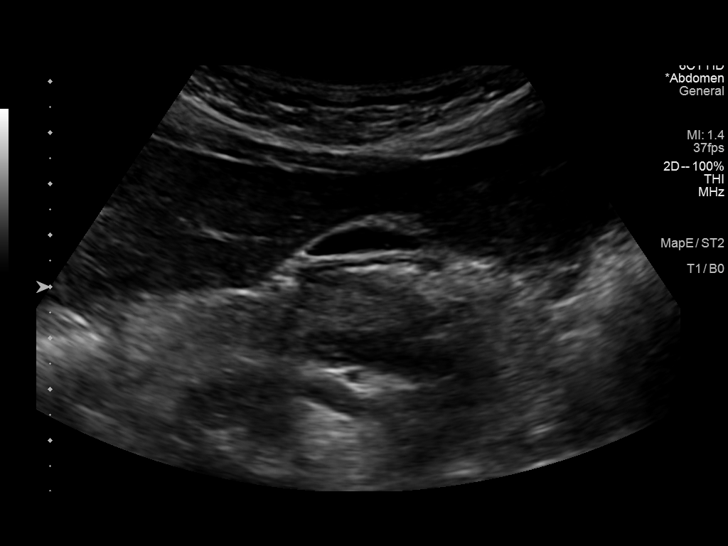
[im 23/67]
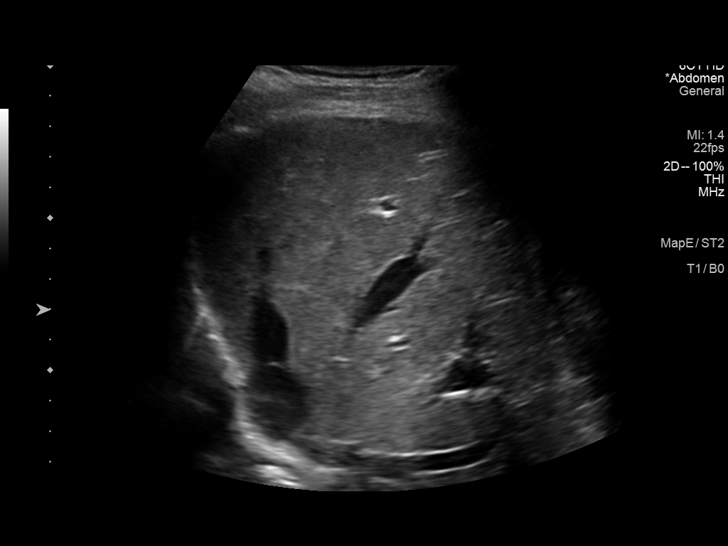
[im 25/67]
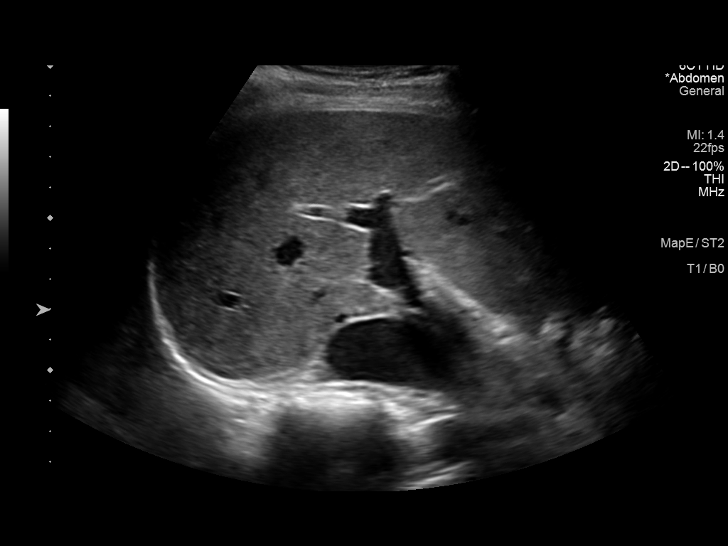
[im 31/67]
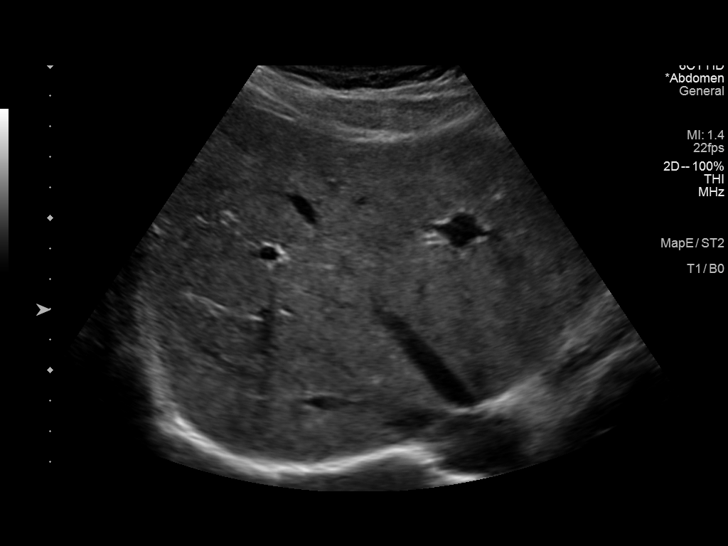
[im 36/67]
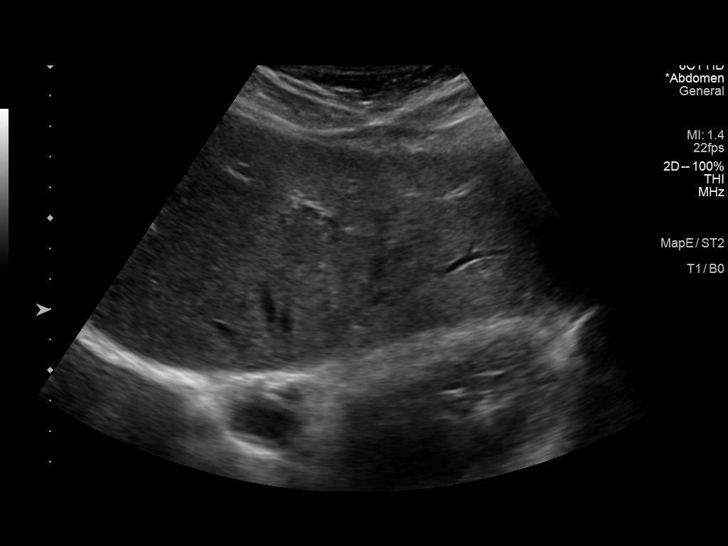
[im 42/67]
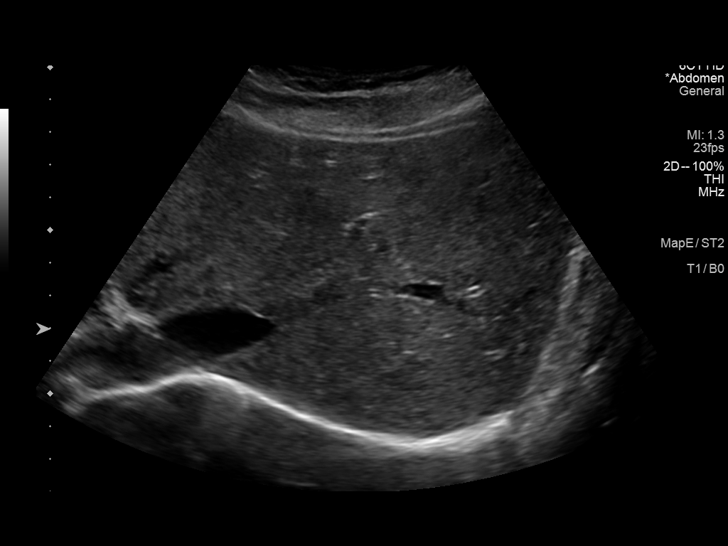
[im 45/67]
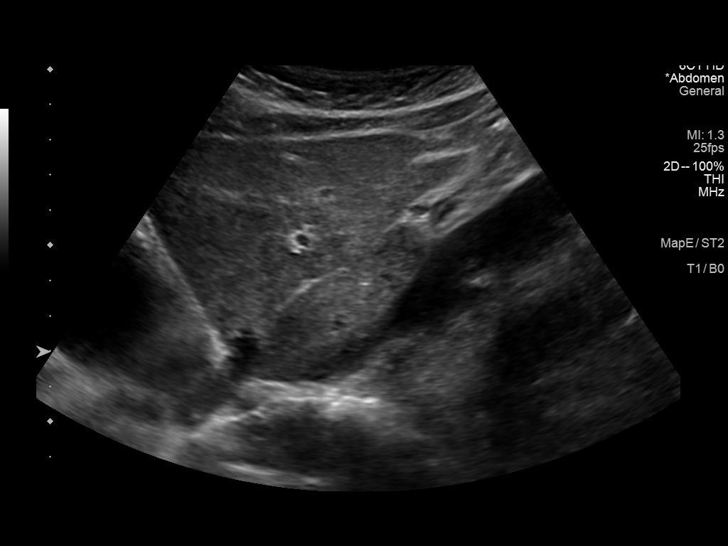
[im 50/67]
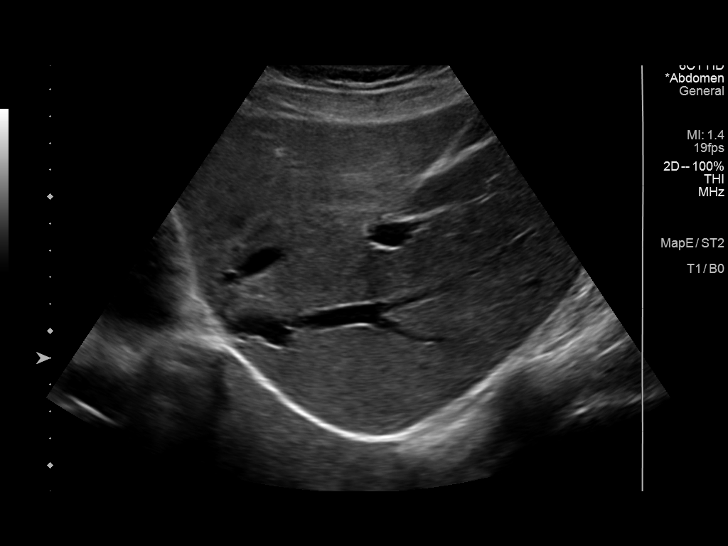
[im 56/67]
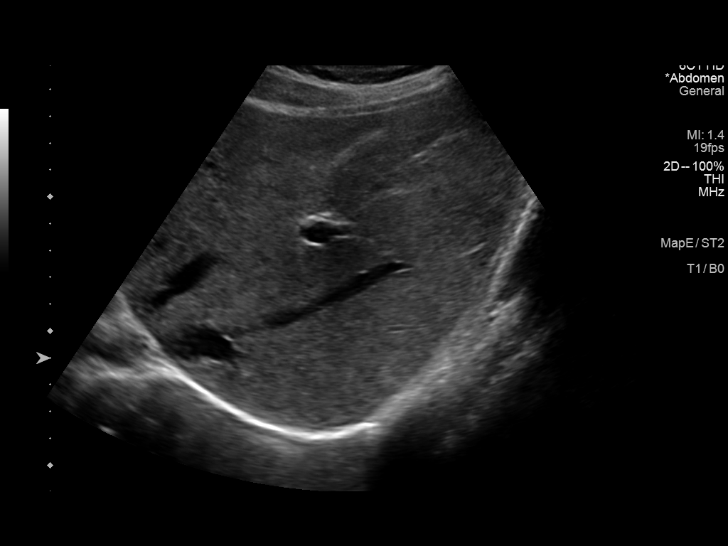
[im 61/67]
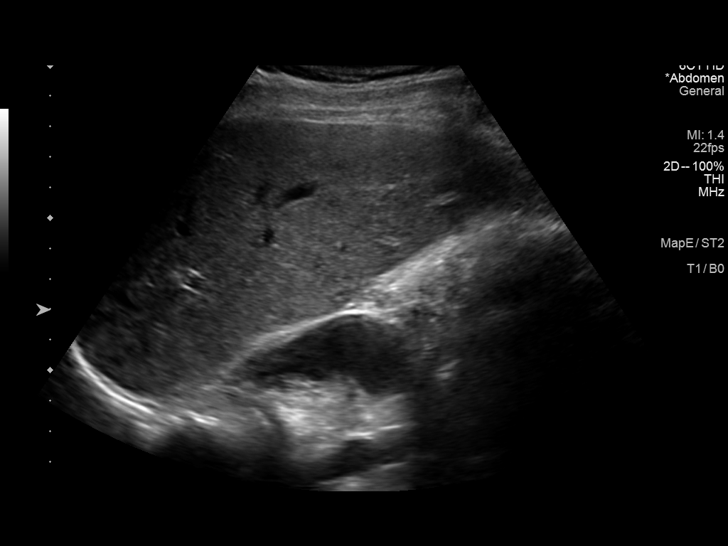
[im 67/67]
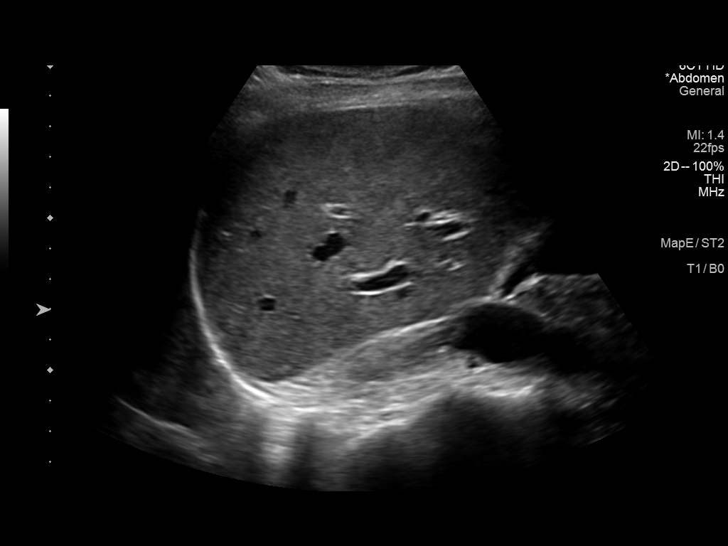

[14 of 25 positions shown; findings below may reference images not displayed]

FINDINGS: Gallbladder:

No gallstones or wall thickening visualized (2.5 mm). No sonographic
Murphy sign noted by sonographer.

Common bile duct:

Diameter: 1.9 mm

Liver:

No focal lesion identified. Within normal limits in parenchymal
echogenicity. Portal vein is patent on color Doppler imaging with
normal direction of blood flow towards the liver.

Other: None.
IMPRESSION: Normal right upper quadrant ultrasound.

## 2021-12-24 MED ORDER — OMEPRAZOLE 40 MG PO CPDR: 40.0000 mg | DELAYED_RELEASE_CAPSULE | Freq: Every day | ORAL | 1 refills | Status: DC

## 2021-12-24 NOTE — Progress Notes (Signed)
Established Patient Office Visit     CC/Reason for Visit: Epigastric pain, bloating  HPI: Tammy Ballard is a 31 y.o. female who is coming in today for the above mentioned reasons. Past Medical History is significant for: Vitamin D deficiency.  For about 6 weeks she has been experiencing epigastric and at times left upper quadrant pain.  She noticed that it began after she had wine 1 night.  She also notes that tomato based products make it worse.  She has been having a lot of belching and abdominal bloating.  Denies diarrhea.  A little nauseous at times.  Feels like she might be losing weight although her weight is stable per her flowsheet.   Past Medical/Surgical History: Past Medical History:  Diagnosis Date   Heart murmur    Migraines    UTI (urinary tract infection)     Past Surgical History:  Procedure Laterality Date   CESAREAN SECTION     x2    Social History:  reports that she has never smoked. She has never used smokeless tobacco. She reports current alcohol use. She reports that she does not use drugs.  Allergies: No Known Allergies  Family History:  Family History  Problem Relation Age of Onset   Hypertension Mother    Rheum arthritis Mother    Diabetes Father      Current Outpatient Medications:    Multiple Vitamins-Calcium (ONE-A-DAY WOMENS FORMULA PO), Take by mouth., Disp: , Rfl:    omeprazole (PRILOSEC) 40 MG capsule, Take 1 capsule (40 mg total) by mouth daily., Disp: 90 capsule, Rfl: 1  Review of Systems:  Constitutional: Denies fever, chills, diaphoresis, appetite change and fatigue.  HEENT: Denies photophobia, eye pain, redness, hearing loss, ear pain, congestion, sore throat, rhinorrhea, sneezing, mouth sores, trouble swallowing, neck pain, neck stiffness and tinnitus.   Respiratory: Denies SOB, DOE, cough, chest tightness,  and wheezing.   Cardiovascular: Denies chest pain, palpitations and leg swelling.  Gastrointestinal: Denies   vomiting,  diarrhea, constipation, blood in stool. Genitourinary: Denies dysuria, urgency, frequency, hematuria, flank pain and difficulty urinating.  Endocrine: Denies: hot or cold intolerance, sweats, changes in hair or nails, polyuria, polydipsia. Musculoskeletal: Denies myalgias, back pain, joint swelling, arthralgias and gait problem.  Skin: Denies pallor, rash and wound.  Neurological: Denies dizziness, seizures, syncope, weakness, light-headedness, numbness and headaches.  Hematological: Denies adenopathy. Easy bruising, personal or family bleeding history  Psychiatric/Behavioral: Denies suicidal ideation, mood changes, confusion, nervousness, sleep disturbance and agitation    Physical Exam: Vitals:   12/24/21 1354  BP: 90/60  Pulse: 93  Temp: 98.5 F (36.9 C)  TempSrc: Oral  SpO2: 98%  Weight: 132 lb 6.4 oz (60.1 kg)  Height: 5\' 1"  (1.549 m)    Body mass index is 25.02 kg/m.    Constitutional: NAD, calm, comfortable Eyes: PERRL, lids and conjunctivae normal ENMT: Mucous membranes are moist.  Respiratory: clear to auscultation bilaterally, no wheezing, no crackles. Normal respiratory effort. No accessory muscle use.  Cardiovascular: Regular rate and rhythm, no murmurs / rubs / gallops. No extremity edema.  Abdomen: Slight tenderness to palpation of epigastric and left upper quadrant areas, no masses palpated. No hepatosplenomegaly. Bowel sounds positive.  Psychiatric: Normal judgment and insight. Alert and oriented x 3. Normal mood.    Impression and Plan:  Gastroesophageal reflux disease, unspecified whether esophagitis present  - Plan: omeprazole (PRILOSEC) 40 MG capsule -Symptoms sound consistent with GERD.  I will treat empirically with omeprazole.  If  no improvement can consider GI referral.    Time spent:32 minutes reviewing chart, interviewing and examining patient and formulating plan of care.    Lelon Frohlich, MD [facility] at  [facility]

## 2022-03-31 ENCOUNTER — Ambulatory Visit (INDEPENDENT_AMBULATORY_CARE_PROVIDER_SITE_OTHER): Payer: PRIVATE HEALTH INSURANCE | Admitting: Internal Medicine

## 2022-03-31 ENCOUNTER — Encounter: Payer: Self-pay | Admitting: Internal Medicine

## 2022-03-31 VITALS — BP 102/68 | HR 73 | Temp 98.2°F | Ht 60.0 in | Wt 132.5 lb

## 2022-03-31 DIAGNOSIS — Z0000 Encounter for general adult medical examination without abnormal findings: Secondary | ICD-10-CM

## 2022-03-31 DIAGNOSIS — Z1159 Encounter for screening for other viral diseases: Secondary | ICD-10-CM

## 2022-03-31 DIAGNOSIS — K219 Gastro-esophageal reflux disease without esophagitis: Secondary | ICD-10-CM

## 2022-03-31 DIAGNOSIS — Z114 Encounter for screening for human immunodeficiency virus [HIV]: Secondary | ICD-10-CM | POA: Diagnosis not present

## 2022-03-31 DIAGNOSIS — E559 Vitamin D deficiency, unspecified: Secondary | ICD-10-CM

## 2022-03-31 DIAGNOSIS — Z136 Encounter for screening for cardiovascular disorders: Secondary | ICD-10-CM | POA: Diagnosis not present

## 2022-03-31 LAB — COMPREHENSIVE METABOLIC PANEL
ALT: 8 U/L (ref 0–35)
AST: 14 U/L (ref 0–37)
Albumin: 4.1 g/dL (ref 3.5–5.2)
Alkaline Phosphatase: 40 U/L (ref 39–117)
BUN: 10 mg/dL (ref 6–23)
CO2: 29 mEq/L (ref 19–32)
Calcium: 9.4 mg/dL (ref 8.4–10.5)
Chloride: 105 mEq/L (ref 96–112)
Creatinine, Ser: 0.8 mg/dL (ref 0.40–1.20)
GFR: 96.89 mL/min (ref >60.00)
Glucose, Bld: 88 mg/dL (ref 70–99)
Potassium: 4.3 mEq/L (ref 3.5–5.1)
Sodium: 139 mEq/L (ref 135–145)
Total Bilirubin: 0.8 mg/dL (ref 0.2–1.2)
Total Protein: 7.4 g/dL (ref 6.0–8.3)

## 2022-03-31 LAB — CBC WITH DIFFERENTIAL/PLATELET
Basophils Absolute: 0 10*3/uL (ref 0.0–0.1)
Basophils Relative: 0.7 % (ref 0.0–3.0)
Eosinophils Absolute: 0.1 10*3/uL (ref 0.0–0.7)
Eosinophils Relative: 2.4 % (ref 0.0–5.0)
HCT: 33.4 % — ABNORMAL LOW (ref 36.0–46.0)
Hemoglobin: 11.3 g/dL — ABNORMAL LOW (ref 12.0–15.0)
Lymphocytes Relative: 43.9 % (ref 12.0–46.0)
Lymphs Abs: 1.6 10*3/uL (ref 0.7–4.0)
MCHC: 33.8 g/dL (ref 30.0–36.0)
MCV: 89.2 fl (ref 78.0–100.0)
Monocytes Absolute: 0.2 10*3/uL (ref 0.1–1.0)
Monocytes Relative: 5.5 % (ref 3.0–12.0)
Neutro Abs: 1.7 10*3/uL (ref 1.4–7.7)
Neutrophils Relative %: 47.5 % (ref 43.0–77.0)
Platelets: 238 10*3/uL (ref 150.0–400.0)
RBC: 3.75 Mil/uL — ABNORMAL LOW (ref 3.87–5.11)
RDW: 13.6 % (ref 11.5–15.5)
WBC: 3.6 10*3/uL — ABNORMAL LOW (ref 4.0–10.5)

## 2022-03-31 LAB — LIPID PANEL
Cholesterol: 117 mg/dL (ref 0–200)
HDL: 42.9 mg/dL (ref >39.00)
LDL Cholesterol: 61 mg/dL (ref 0–99)
NonHDL: 74.47
Total CHOL/HDL Ratio: 3
Triglycerides: 67 mg/dL (ref 0.0–149.0)
VLDL: 13.4 mg/dL (ref 0.0–40.0)

## 2022-03-31 LAB — VITAMIN D 25 HYDROXY (VIT D DEFICIENCY, FRACTURES): VITD: 23.12 ng/mL — ABNORMAL LOW (ref 30.00–100.00)

## 2022-03-31 LAB — TSH: TSH: 1.31 u[IU]/mL (ref 0.35–5.50)

## 2022-03-31 LAB — VITAMIN B12: Vitamin B-12: 344 pg/mL (ref 211–911)

## 2022-03-31 MED ORDER — OMEPRAZOLE 40 MG PO CPDR: 40.0000 mg | DELAYED_RELEASE_CAPSULE | Freq: Every day | ORAL | 1 refills | Status: DC

## 2022-03-31 NOTE — Progress Notes (Signed)
Established Patient Office Visit     CC/Reason for Visit: Annual preventive exam  HPI: Tammy Ballard is a 31 y.o. female who is coming in today for the above mentioned reasons. Past Medical History is significant for: GERD and vitamin D deficiency.  At last visit she was prescribed omeprazole which she took consistently for 8 weeks with significant improvement.  Shortly after discontinuing her symptoms returned.  She needs a refill of this medication.  She has never had an upper endoscopy.  She is overdue for COVID, flu and consideration of HPV vaccines.  She has an appointment in September with [facility] for her GYN care.  She is overdue for an eye and dental exams.  She is otherwise well.   Past Medical/Surgical History: Past Medical History:  Diagnosis Date   Heart murmur    Migraines    UTI (urinary tract infection)     Past Surgical History:  Procedure Laterality Date   CESAREAN SECTION     x2    Social History:  reports that she has never smoked. She has never used smokeless tobacco. She reports current alcohol use. She reports that she does not use drugs.  Allergies: No Known Allergies  Family History:  Family History  Problem Relation Age of Onset   Hypertension Mother    Rheum arthritis Mother    Diabetes Father      Current Outpatient Medications:    Multiple Vitamins-Calcium (ONE-A-DAY WOMENS FORMULA PO), Take by mouth., Disp: , Rfl:    omeprazole (PRILOSEC) 40 MG capsule, Take 1 capsule (40 mg total) by mouth daily., Disp: 90 capsule, Rfl: 1  Review of Systems:  Constitutional: Denies fever, chills, diaphoresis, appetite change and fatigue.  HEENT: Denies photophobia, eye pain, redness, hearing loss, ear pain, congestion, sore throat, rhinorrhea, sneezing, mouth sores, trouble swallowing, neck pain, neck stiffness and tinnitus.   Respiratory: Denies SOB, DOE, cough, chest tightness,  and wheezing.   Cardiovascular: Denies chest pain,  palpitations and leg swelling.  Gastrointestinal: Denies nausea, vomiting,  diarrhea, constipation, blood in stool and abdominal distention.  Genitourinary: Denies dysuria, urgency, frequency, hematuria, flank pain and difficulty urinating.  Endocrine: Denies: hot or cold intolerance, sweats, changes in hair or nails, polyuria, polydipsia. Musculoskeletal: Denies myalgias, back pain, joint swelling, arthralgias and gait problem.  Skin: Denies pallor, rash and wound.  Neurological: Denies dizziness, seizures, syncope, weakness, light-headedness, numbness and headaches.  Hematological: Denies adenopathy. Easy bruising, personal or family bleeding history  Psychiatric/Behavioral: Denies suicidal ideation, mood changes, confusion, nervousness, sleep disturbance and agitation    Physical Exam: Vitals:   03/31/22 0738  BP: 102/68  Pulse: 73  Temp: 98.2 F (36.8 C)  TempSrc: Oral  SpO2: 98%  Weight: 132 lb 8 oz (60.1 kg)  Height: 5' (1.524 m)    Body mass index is 25.88 kg/m.   Constitutional: NAD, calm, comfortable Eyes: PERRL, lids and conjunctivae normal ENMT: Mucous membranes are moist. Posterior pharynx clear of any exudate or lesions. Normal dentition. Tympanic membrane is pearly white, no erythema or bulging. Neck: normal, supple, no masses, no thyromegaly Respiratory: clear to auscultation bilaterally, no wheezing, no crackles. Normal respiratory effort. No accessory muscle use.  Cardiovascular: Regular rate and rhythm, no murmurs / rubs / gallops. No extremity edema. 2+ pedal pulses. No carotid bruits.  Abdomen: no tenderness, no masses palpated. No hepatosplenomegaly. Bowel sounds positive.  Musculoskeletal: no clubbing / cyanosis. No joint deformity upper and lower extremities. Good ROM, no contractures. Normal  muscle tone.  Skin: no rashes, lesions, ulcers. No induration Neurologic: CN 2-12 grossly intact. Sensation intact, DTR normal. Strength 5/5 in all 4.  Psychiatric:  Normal judgment and insight. Alert and oriented x 3. Normal mood.   Flowsheet Row Office Visit from 03/27/2020 in [facility]  PHQ-9 Total Score 0         Impression and Plan:  Encounter for preventive health examination - Plan: CBC with Differential/Platelet, Comprehensive metabolic panel, Lipid panel, TSH, Vitamin B12, CBC with Differential/Platelet, Comprehensive metabolic panel, Lipid panel, TSH, Vitamin B12 -Recommend routine eye and dental care. -Immunizations: Advised flu, bivalent COVID, HPV vaccines.  She will get flu at work, declines others today despite counseling but will consider. -Healthy lifestyle discussed in detail. -Labs to be updated today. -Colon cancer screening: Commence age 44 -Breast cancer screening: Commence age 55 -Cervical cancer screening: At [facility] later this month -Lung cancer screening: Not applicable -Prostate cancer screening: Not applicable -DEXA: Not applicable  Vitamin D deficiency - Plan: VITAMIN D 25 Hydroxy (Vit-D Deficiency, Fractures), VITAMIN D 25 Hydroxy (Vit-D Deficiency, Fractures)  Gastroesophageal reflux disease, unspecified whether esophagitis present - Plan: omeprazole (PRILOSEC) 40 MG capsule, Ambulatory referral to Gastroenterology  Encounter for screening for HIV - Plan: HIV antibody (with reflex), HIV antibody (with reflex)  Encounter for hepatitis C screening test for low risk patient - Plan: Hep C Antibody, Hep C Antibody      Minerva Ends Philip Aspen, MD [facility]

## 2022-04-01 ENCOUNTER — Other Ambulatory Visit: Payer: Self-pay | Admitting: Internal Medicine

## 2022-04-01 ENCOUNTER — Other Ambulatory Visit: Payer: Self-pay | Admitting: *Deleted

## 2022-04-01 DIAGNOSIS — E559 Vitamin D deficiency, unspecified: Secondary | ICD-10-CM

## 2022-04-01 LAB — HEPATITIS C ANTIBODY: Hepatitis C Ab: NONREACTIVE

## 2022-04-01 LAB — HIV ANTIBODY (ROUTINE TESTING W REFLEX): HIV 1&2 Ab, 4th Generation: NONREACTIVE

## 2022-04-01 MED ORDER — VITAMIN D (ERGOCALCIFEROL) 1.25 MG (50000 UNIT) PO CAPS: 50000.0000 [IU] | ORAL_CAPSULE | ORAL | 0 refills | Status: AC

## 2022-04-19 ENCOUNTER — Encounter: Payer: Self-pay | Admitting: Gastroenterology

## 2022-05-06 ENCOUNTER — Telehealth: Payer: Self-pay | Admitting: Internal Medicine

## 2022-05-06 NOTE — Telephone Encounter (Signed)
Pt has some questions concerning her blood work result from 04-01-2022

## 2022-05-10 NOTE — Telephone Encounter (Signed)
Left message on machine returning patient's call 

## 2022-05-10 NOTE — Telephone Encounter (Signed)
Patient returned call for results 

## 2022-05-12 ENCOUNTER — Encounter: Payer: Self-pay | Admitting: *Deleted

## 2022-05-12 NOTE — Telephone Encounter (Signed)
Spoke with patient.  She is concerned because her WBC is low.  Please advise.  Okay to respond via [place].

## 2022-05-26 ENCOUNTER — Ambulatory Visit: Payer: PRIVATE HEALTH INSURANCE | Admitting: Gastroenterology

## 2022-08-06 ENCOUNTER — Encounter: Payer: Self-pay | Admitting: Gastroenterology

## 2022-08-06 ENCOUNTER — Ambulatory Visit (INDEPENDENT_AMBULATORY_CARE_PROVIDER_SITE_OTHER): Payer: 59 | Admitting: Gastroenterology

## 2022-08-06 ENCOUNTER — Other Ambulatory Visit (INDEPENDENT_AMBULATORY_CARE_PROVIDER_SITE_OTHER): Payer: 59

## 2022-08-06 VITALS — BP 98/65 | HR 81 | Ht 60.0 in | Wt 132.0 lb

## 2022-08-06 DIAGNOSIS — D649 Anemia, unspecified: Secondary | ICD-10-CM

## 2022-08-06 DIAGNOSIS — R142 Eructation: Secondary | ICD-10-CM

## 2022-08-06 DIAGNOSIS — R109 Unspecified abdominal pain: Secondary | ICD-10-CM

## 2022-08-06 DIAGNOSIS — R140 Abdominal distension (gaseous): Secondary | ICD-10-CM

## 2022-08-06 DIAGNOSIS — E559 Vitamin D deficiency, unspecified: Secondary | ICD-10-CM

## 2022-08-06 LAB — CBC
HCT: 35.6 % — ABNORMAL LOW (ref 36.0–46.0)
Hemoglobin: 12 g/dL (ref 12.0–15.0)
MCHC: 33.8 g/dL (ref 30.0–36.0)
MCV: 89.9 fl (ref 78.0–100.0)
Platelets: 284 10*3/uL (ref 150.0–400.0)
RBC: 3.96 Mil/uL (ref 3.87–5.11)
RDW: 14.1 % (ref 11.5–15.5)
WBC: 7.1 10*3/uL (ref 4.0–10.5)

## 2022-08-06 LAB — VITAMIN D 25 HYDROXY (VIT D DEFICIENCY, FRACTURES): VITD: 32.94 ng/mL (ref 30.00–100.00)

## 2022-08-06 NOTE — Progress Notes (Signed)
Chief Complaint: Abdominal gas, belching   Referring Provider:     Isaac Bliss, Rayford Halsted, MD    HPI:     Tammy Ballard is a 32 y.o. female referred to the Gastroenterology Clinic for evaluation of upper GI symptoms.   Sxs started ~10 months ago as gas/pressure feeling in mid abdomen. + Belching.  Was seen by [facility] and diagnosed with GERD and started omeprazole 40 mg/day in June. Doesn't think this helped, so she has since stopped taking.  Was then seen by [facility] and started supplements, herbals, etc for IBS without much change. Also went to a Naturalist.   Started mullen and fenyl tea and "blood cleanser" tea over the last few months with overall improvement. Also made a change in her probiotic.   No HB, regurgitation. +belching. No dysphagia.   Does have a history of vitamin D deficiency.  Was prescribed ergocalciferol in September (vitamin D 23) x 12 weeks which she recently completed.  Will send to lab for repeat to evaluate response.  Labs in September also with H/H 11.3/33.4 with MCV/RDW 89/13.6, along with WBC 3.6.  Prior to that, normal CBC in 09/2020.  Normal B12, TSH.  No previous EGD or colonoscopy.   Father died in Nov 01, 2021 with Pancreatic CA. No known family history of CRC, GI malignancy, liver disease, or IBD.     Past Medical History:  Diagnosis Date   Heart murmur    Migraines    UTI (urinary tract infection)      Past Surgical History:  Procedure Laterality Date   CESAREAN SECTION     x2   Family History  Problem Relation Age of Onset   Hypertension Mother    Rheum arthritis Mother    Diabetes Father    Stomach cancer Neg Hx    Colon cancer Neg Hx    Esophageal cancer Neg Hx    Social History   Tobacco Use   Smoking status: Never   Smokeless tobacco: Never  Vaping Use   Vaping Use: Never used  Substance Use Topics   Alcohol use: Not Currently    Comment: occasional   Drug use: Never   Current Outpatient  Medications  Medication Sig Dispense Refill   Multiple Vitamins-Calcium (ONE-A-DAY WOMENS FORMULA PO) Take by mouth.     omeprazole (PRILOSEC) 40 MG capsule Take 1 capsule (40 mg total) by mouth daily. (Patient not taking: Reported on 08/06/2022) 90 capsule 1   No current facility-administered medications for this visit.   No Known Allergies   Review of Systems: All systems reviewed and negative except where noted in HPI.     Physical Exam:    Wt Readings from Last 3 Encounters:  08/06/22 132 lb (59.9 kg)  03/31/22 132 lb 8 oz (60.1 kg)  12/24/21 132 lb 6.4 oz (60.1 kg)    BP 98/65   Pulse 81   Ht 5' (1.524 m)   Wt 132 lb (59.9 kg)   BMI 25.78 kg/m  Constitutional:  Pleasant, in no acute distress. Psychiatric: Normal mood and affect. Behavior is normal. Cardiovascular: Normal rate, regular rhythm. No edema Pulmonary/chest: Effort normal and breath sounds normal. No wheezing, rales or rhonchi. Abdominal: Soft, nondistended, nontender. Bowel sounds active throughout. There are no masses palpable. No hepatomegaly. Neurological: Alert and oriented to person place and time. Skin: Skin is warm and dry. No rashes noted.   ASSESSMENT AND PLAN;  1) Abdominal bloating 2) Belching 3) Abdominal discomfort 4) Abdominal gas 5) Vitamin D deficiency Discussed broad DDx for presenting symptoms with patient today.  Does not have typical reflux symptoms.  Discussed DDx to include SIBO/IMO, H. pylori, celiac, etc. with plan as follows:  - Discussed role/utility of expedited EGD to evaluate for mucosal/luminal pathology, as she declined at this time - Celiac panel - SIBO breath testing - H pylori stool Ag testing - If unrevealing, EGD to evaluate for erosive esophagitis, LES laxity, hiatal hernia, and duodenal/gastric bxs - Repeat vitamin D to evaluate for clinical response to ergocalciferol  6) Normocytic anemia - Repeat CBC.  If still anemic, add on iron panel with again  consideration for endoscopic evaluation     Lavena Bullion, DO, FACG  08/06/2022, 3:44 PM   Isaac Bliss, [place]*

## 2022-08-06 NOTE — Patient Instructions (Signed)
Your provider has requested that you go to the basement level for lab work before leaving today. Press "B" on the elevator. The lab is located at the first door on the left as you exit the elevator.   You have been given a testing kit to check for small intestine bacterial overgrowth (SIBO) which is completed by a company named [facility]. Make sure to return your test in the mail using the return mailing label given to you along with the kit. Your demographic and insurance information have already been sent to the company and they should be in contact with you over the next 1-2 weeks regarding this test. [facility] will collect an upfront charge of $99.74 for commercial insurance plans and $209.74 is you are paying cash. Make sure to discuss with [facility] PRIOR to having the test to see if they have gotten information from your insurance company as to how much your testing will cost out of pocket, if any. Please keep in mind that you will be getting a call from phone number [phone] or a similar number. If you do not hear from them within this time frame, please call our office at [phone] or call [facility] directly at [phone].     If you are age 27 or younger, your body mass index should be between 19-25. Your Body mass index is 25.78 kg/m. If this is out of the aformentioned range listed, please consider follow up with your Primary Care Provider.   Due to recent changes in healthcare laws, you may see the results of your imaging and laboratory studies on MyChart before your provider has had a chance to review them.  We understand that in some cases there may be results that are confusing or concerning to you. Not all laboratory results come back in the same time frame and the provider may be waiting for multiple results in order to interpret others.  Please give [place] 48 hours in order for your provider to thoroughly review all the results before contacting the office  for clarification of your results.    __________________________________________________________  The [facility] providers would like to encourage you to use [facility] to communicate with providers for non-urgent requests or questions.  Due to long hold times on the telephone, sending your provider a message by [facility] may be a faster and more efficient way to get a response.  Please allow 48 business hours for a response.  Please remember that this is for non-urgent requests.    Thank you for choosing me and [facility].  Vito Cirigliano, D.O.

## 2022-08-07 LAB — TISSUE TRANSGLUTAMINASE, IGA: (tTG) Ab, IgA: <1 U/mL

## 2022-08-07 LAB — IGA: Immunoglobulin A: 136 mg/dL (ref 47–310)

## 2022-08-09 ENCOUNTER — Other Ambulatory Visit: Payer: Self-pay | Admitting: *Deleted

## 2022-08-09 ENCOUNTER — Other Ambulatory Visit: Payer: Self-pay | Admitting: Internal Medicine

## 2022-08-09 DIAGNOSIS — E559 Vitamin D deficiency, unspecified: Secondary | ICD-10-CM

## 2022-08-09 MED ORDER — VITAMIN D (ERGOCALCIFEROL) 1.25 MG (50000 UNIT) PO CAPS: 50000.0000 [IU] | ORAL_CAPSULE | ORAL | 0 refills | Status: DC

## 2022-08-10 ENCOUNTER — Telehealth: Payer: Self-pay | Admitting: Internal Medicine

## 2022-08-10 NOTE — Telephone Encounter (Signed)
Pt has scheduled herself a toc from Dr Isaac Bliss to Dr Grandville Silos via Deloris Ping. I have left her a vm as to why she would like  to transfer her care.

## 2022-08-11 ENCOUNTER — Other Ambulatory Visit: Payer: Self-pay | Admitting: Internal Medicine

## 2022-08-11 DIAGNOSIS — E559 Vitamin D deficiency, unspecified: Secondary | ICD-10-CM

## 2022-08-11 MED ORDER — VITAMIN D (ERGOCALCIFEROL) 1.25 MG (50000 UNIT) PO CAPS: 50000.0000 [IU] | ORAL_CAPSULE | ORAL | 0 refills | Status: DC

## 2022-08-12 NOTE — Telephone Encounter (Signed)
Pt thought we are a neurologist office, she wanted her appointment cancelled, so I did.

## 2022-08-16 ENCOUNTER — Ambulatory Visit (INDEPENDENT_AMBULATORY_CARE_PROVIDER_SITE_OTHER): Payer: Self-pay

## 2022-08-16 ENCOUNTER — Ambulatory Visit (HOSPITAL_COMMUNITY)
Admission: EM | Admit: 2022-08-16 | Discharge: 2022-08-16 | Disposition: A | Discharge status: Home / Self Care | Payer: Self-pay | Attending: Internal Medicine | Admitting: Internal Medicine

## 2022-08-16 ENCOUNTER — Encounter (HOSPITAL_COMMUNITY): Payer: Self-pay | Admitting: Emergency Medicine

## 2022-08-16 DIAGNOSIS — R0789 Other chest pain: Secondary | ICD-10-CM

## 2022-08-16 DIAGNOSIS — R0602 Shortness of breath: Secondary | ICD-10-CM

## 2022-08-16 HISTORY — DX: Gastro-esophageal reflux disease without esophagitis: K21.9

## 2022-08-16 LAB — COMPREHENSIVE METABOLIC PANEL
ALT: 11 U/L (ref 0–44)
AST: 18 U/L (ref 15–41)
Albumin: 4.2 g/dL (ref 3.5–5.0)
Alkaline Phosphatase: 39 U/L (ref 38–126)
Anion gap: 8 (ref 5–15)
BUN: 10 mg/dL (ref 6–20)
CO2: 25 mmol/L (ref 22–32)
Calcium: 9.6 mg/dL (ref 8.9–10.3)
Chloride: 103 mmol/L (ref 98–111)
Creatinine, Ser: 0.92 mg/dL (ref 0.44–1.00)
GFR, Estimated: >60 mL/min (ref >60)
Glucose, Bld: 96 mg/dL (ref 70–99)
Potassium: 4.2 mmol/L (ref 3.5–5.1)
Sodium: 136 mmol/L (ref 135–145)
Total Bilirubin: 1.2 mg/dL (ref 0.3–1.2)
Total Protein: 7.6 g/dL (ref 6.5–8.1)

## 2022-08-16 LAB — CBC
HCT: 37 % (ref 36.0–46.0)
Hemoglobin: 12.4 g/dL (ref 12.0–15.0)
MCH: 30.2 pg (ref 26.0–34.0)
MCHC: 33.5 g/dL (ref 30.0–36.0)
MCV: 90.2 fL (ref 80.0–100.0)
Platelets: 279 10*3/uL (ref 150–400)
RBC: 4.1 MIL/uL (ref 3.87–5.11)
RDW: 13.3 % (ref 11.5–15.5)
WBC: 6.1 10*3/uL (ref 4.0–10.5)
nRBC: 0 % (ref 0.0–0.2)

## 2022-08-16 LAB — POC URINE PREG, ED: Preg Test, Ur: NEGATIVE

## 2022-08-16 NOTE — ED Notes (Signed)
Called on phone, states coming inside

## 2022-08-16 NOTE — Discharge Instructions (Addendum)
We will call you if any of your test results warrant a change in your plan of care, you may view these test results on MyChart.   You may start taking ibuprofen to see if this would help, you can take ibuprofen every 6 hours (400-600 mg) , do not take more than 2400 mg in a 24-hour day.  I advised that you do not take ibuprofen on an empty stomach, ibuprofen can cause GI problems such as GI bleeding.   If you develop any severe/worsening symptoms at home please go to the nearest emergency department.   As discussed, I think it is wise for you to follow-up with your primary care doctor tomorrow.

## 2022-08-16 NOTE — ED Notes (Signed)
No answer in lobby.

## 2022-08-16 NOTE — ED Provider Notes (Addendum)
[facility]    CSN: [id number] Arrival date & time: 08/16/22  1159      History   Chief Complaint Chief Complaint  Patient presents with   Chest Pain    HPI Tammy Ballard is a 32 y.o. female. Patient presents c/o intermittent central "sharp"  chest pain that started yesterday.  She denies any fall or trauma to the chest.  She denies any lifting or exercise that she can attribute to her symptoms.  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  She reports that she will have intermittent bilateral chest pain, she states that it started on her right side and has migrated to the left side today.  She denies any changes to the skin on her chest.  She reports an episode of similar symptoms that have occurred years ago, she reports that she was evaluated by healthcare provider and everything came back normal.  She denies any chronic health history.  She reports that she has taken Tylenol with minimal relief of symptoms.   She denies any current stress, she states that she is uncertain if her symptoms are brought on by anxiety, she reports that she does not feel anxious.  She reports the possibility of pregnancy, last menstrual period was on 07/26/2022.    Chest Pain Associated symptoms: palpitations and shortness of breath (Intermittent episodes)   Associated symptoms: no cough, no dizziness, no fatigue, no fever, no headache, no numbness and no weakness     Past Medical History:  Diagnosis Date   GERD (gastroesophageal reflux disease)    Heart murmur    Migraines    UTI (urinary tract infection)     Patient Active Problem List   Diagnosis Date Noted   Vitamin D deficiency 08/15/2020    Past  Surgical History:  Procedure Laterality Date   CESAREAN SECTION     x2    OB History   No obstetric history on file.      Home Medications    Prior to Admission medications   Medication Sig Start Date End Date Taking? Authorizing Provider  Multiple Vitamins-Calcium (ONE-A-DAY WOMENS FORMULA PO) Take by mouth.    [provider]    Family History Family History  Problem Relation Age of Onset   Hypertension Mother    Rheum arthritis Mother    Diabetes Father    Stomach cancer Neg Hx    Colon cancer Neg Hx    Esophageal cancer Neg Hx     Social History Social History   Tobacco Use   Smoking status: Never   Smokeless tobacco: Never  Vaping Use   Vaping Use: Never used  Substance Use Topics   Alcohol use: Not Currently    Comment: occasional   Drug use: Never     Allergies   Patient has no known allergies.   Review of Systems Review of Systems  Constitutional:  Negative for activity change, appetite change, chills, fatigue, fever and unexpected weight change.  Eyes:  Negative for photophobia and visual disturbance.  Respiratory:  Positive for shortness of breath (Intermittent episodes). Negative for cough, chest tightness and wheezing.   Cardiovascular:  Positive for chest pain and palpitations. Negative  for leg swelling.  Gastrointestinal: Negative.   Endocrine: Negative.   Genitourinary: Negative.   Neurological:  Positive for light-headedness (Intermittent). Negative for dizziness, tremors, seizures, syncope, facial asymmetry, speech difficulty, weakness, numbness and headaches.     Physical Exam Triage Vital Signs ED Triage Vitals [08/16/22 1209]  Enc Vitals Group     BP 111/74     Pulse Rate 85     Resp 18     Temp 98 F (36.7 C)     Temp Source Oral     SpO2 98 %     Weight      Height      Head Circumference      Peak Flow      Pain Score 3     Pain Loc      Pain Edu?      Excl. in [place]?    No data found.  Updated Vital  Signs BP 111/74 (BP Location: Right Arm)   Pulse 85   Temp 98 F (36.7 C) (Oral)   Resp 18   LMP 07/26/2022   SpO2 98%     Physical Exam Vitals and nursing note reviewed.  Constitutional:      Appearance: She is well-developed.  Cardiovascular:     Rate and Rhythm: Normal rate and regular rhythm.     Heart sounds: Normal heart sounds, S1 normal and S2 normal.  Pulmonary:     Effort: Pulmonary effort is normal.     Breath sounds: Normal breath sounds and air entry. No decreased breath sounds, wheezing, rhonchi or rales.  Neurological:     General: No focal deficit present.     Mental Status: She is alert.     GCS: GCS eye subscore is 4. GCS verbal subscore is 5. GCS motor subscore is 6.  Psychiatric:        Attention and Perception: Attention normal.        Mood and Affect: Mood and affect normal.        Speech: Speech normal.        Behavior: Behavior normal. Behavior is cooperative.        Thought Content: Thought content normal.        Cognition and Memory: Cognition normal.        Judgment: Judgment normal.      UC Treatments / Results  Labs (all labs ordered are listed, but only abnormal results are displayed) Labs Reviewed  COMPREHENSIVE METABOLIC PANEL  CBC  POC URINE PREG, ED    EKG   Radiology DG Chest 2 View  Result Date: 08/16/2022 CLINICAL DATA:  Central chest pain starting yesterday. Sharp pains and shortness of breath. EXAM: CHEST - 2 VIEW COMPARISON:  Chest two views 11/16/2019 FINDINGS: Cardiac silhouette and mediastinal contours are within normal limits. The lungs are clear. No pleural effusion or pneumothorax. No acute skeletal abnormality. IMPRESSION: No active cardiopulmonary disease. Electronically Signed   By: Yvonne Kendall M.D.   On: 08/16/2022 16:11    Procedures Procedures (including critical care time)  Medications Ordered in UC Medications - No data to display  Initial Impression / Assessment and Plan / UC Course  I have reviewed  the triage vital signs and the nursing notes.  Pertinent labs & imaging results that were available during my care of the patient were reviewed by me and considered in my medical decision making (see chart for details).     Patient was evaluated for chest wall pain.  EKG  showed normal sinus rhythm.  Chest x-ray was normal showing no active cardiopulmonary disease.  POC urine pregnancy was negative.  CMP and CBC collected to verify normal electrolytes, blood count, kidney, and liver function.  Uncertain etiology of patient's symptoms, symptoms may be related to chest wall muscular strain versus anxiety.  Patient has a follow-up appointment with PCP for tomorrow.  Patient was made aware to try ibuprofen to see if this would provide any relief of symptoms.  She was made aware of red flag symptoms that warrant an emergency department visit.  Patient was made aware of results reporting protocol and MyChart.  Patient verbalized understanding of instructions.  Charting was provided using a a verbal dictation system, charting was proofread for errors, errors may occur which could change the meaning of the information charted.   Final Clinical Impressions(s) / UC Diagnoses   Final diagnoses:  Chest wall pain     Discharge Instructions      We will call you if any of your test results warrant a change in your plan of care, you may view these test results on MyChart.   You may start taking ibuprofen to see if this would help, you can take ibuprofen every 6 hours (400-600 mg) , do not take more than 2400 mg in a 24-hour day.  I advised that you do not take ibuprofen on an empty stomach, ibuprofen can cause GI problems such as GI bleeding.   If you develop any severe/worsening symptoms at home please go to the nearest emergency department.   As discussed, I think it is wise for you to follow-up with your primary care doctor tomorrow.     ED Prescriptions   None    PDMP not reviewed this  encounter.   Flossie Dibble, NP 08/16/22 1659    Flossie Dibble, NP 08/16/22 1659    Flossie Dibble, NP 08/16/22 1704

## 2022-08-16 NOTE — ED Triage Notes (Signed)
Pt reports central chest pain started yesterday. Today while at work reports felt sharp pains and SOB. Reports put pulse ox on while at work and HR was ranging up to 120s and O2 96%.

## 2022-08-16 NOTE — ED Notes (Signed)
Patient is not in building/no answer in lobby

## 2022-08-17 ENCOUNTER — Encounter: Payer: Self-pay | Admitting: Internal Medicine

## 2022-08-17 ENCOUNTER — Ambulatory Visit (INDEPENDENT_AMBULATORY_CARE_PROVIDER_SITE_OTHER): Payer: Self-pay | Admitting: Internal Medicine

## 2022-08-17 ENCOUNTER — Encounter: Payer: Self-pay | Admitting: Obstetrics and Gynecology

## 2022-08-17 VITALS — BP 100/62 | HR 85 | Temp 97.9°F | Wt 128.8 lb

## 2022-08-17 DIAGNOSIS — R079 Chest pain, unspecified: Secondary | ICD-10-CM

## 2022-08-17 DIAGNOSIS — Z09 Encounter for follow-up examination after completed treatment for conditions other than malignant neoplasm: Secondary | ICD-10-CM

## 2022-08-17 DIAGNOSIS — E559 Vitamin D deficiency, unspecified: Secondary | ICD-10-CM

## 2022-08-17 NOTE — Progress Notes (Signed)
     Established Patient Office Visit     CC/Reason for Visit: ED follow-up  HPI: Tammy Ballard is a 32 y.o. female who is coming in today for the above mentioned reasons. Past Medical History is significant for: GERD and vitamin D deficiency.  She has not taken PPI therapy in some time.  She has been taking some sort of digestive tea that she feels works well for her.  Yesterday while at work she had some chest discomfort and visited the emergency department.  Workup was essentially negative.   Past Medical/Surgical History: Past Medical History:  Diagnosis Date   GERD (gastroesophageal reflux disease)    Heart murmur    Migraines    UTI (urinary tract infection)     Past Surgical History:  Procedure Laterality Date   CESAREAN SECTION     x2    Social History:  reports that she has never smoked. She has never used smokeless tobacco. She reports that she does not currently use alcohol. She reports that she does not use drugs.  Allergies: No Known Allergies  Family History:  Family History  Problem Relation Age of Onset   Hypertension Mother    Rheum arthritis Mother    Diabetes Father    Stomach cancer Neg Hx    Colon cancer Neg Hx    Esophageal cancer Neg Hx      Current Outpatient Medications:    Multiple Vitamins-Calcium (ONE-A-DAY WOMENS FORMULA PO), Take by mouth., Disp: , Rfl:   Review of Systems:  Negative unless indicated in HPI.   Physical Exam: Vitals:   08/17/22 1559  BP: 100/62  Pulse: 85  Temp: 97.9 F (36.6 C)  TempSrc: Oral  SpO2: 98%  Weight: 128 lb 12.8 oz (58.4 kg)    Body mass index is 25.15 kg/m.   Physical Exam Vitals reviewed.  Constitutional:      Appearance: Normal appearance.  HENT:     Head: Normocephalic and atraumatic.  Eyes:     Conjunctiva/sclera: Conjunctivae normal.     Pupils: Pupils are equal, round, and reactive to light.  Skin:    General: Skin is warm and dry.  Neurological:     General: No focal  deficit present.     Mental Status: She is alert and oriented to person, place, and time.  Psychiatric:        Mood and Affect: Mood normal.        Behavior: Behavior normal.        Thought Content: Thought content normal.        Judgment: Judgment normal.      Impression and Plan:  Hospital discharge follow-up  Chest pain, unspecified type  Vitamin D deficiency  -Urgent care records reviewed in detail.  She presented with chest pain and workup was essentially negative.  She has had GERD in the past and is no longer taking PPI therapy.  Previous GI notes reviewed and EGD was recommended but she declined.  Have advised that if chest pain continues that she may want to reconsider that.  Time spent:30 minutes reviewing chart, interviewing and examining patient and formulating plan of care.     Lelon Frohlich, MD [facility] at [facility]

## 2022-08-19 ENCOUNTER — Ambulatory Visit: Payer: PRIVATE HEALTH INSURANCE | Admitting: Family Medicine

## 2022-08-26 ENCOUNTER — Encounter: Payer: PRIVATE HEALTH INSURANCE | Admitting: Nurse Practitioner

## 2022-09-13 ENCOUNTER — Ambulatory Visit (INDEPENDENT_AMBULATORY_CARE_PROVIDER_SITE_OTHER): Payer: 59 | Admitting: Internal Medicine

## 2022-09-13 ENCOUNTER — Encounter: Payer: Self-pay | Admitting: Internal Medicine

## 2022-09-13 VITALS — BP 110/70 | HR 98 | Temp 98.3°F | Wt 128.0 lb

## 2022-09-13 DIAGNOSIS — R6884 Jaw pain: Secondary | ICD-10-CM | POA: Diagnosis not present

## 2022-09-13 NOTE — Progress Notes (Signed)
     Established Patient Office Visit     CC/Reason for Visit: Bilateral ear and jaw pain  HPI: Tammy Ballard is a 32 y.o. female who is coming in today for the above mentioned reasons.  She has noticed lately that when she wakes up she has jaw pain bilaterally.  It also extends up into her ears.  She knows that she grinds her teeth at nighttime.  No recent URI symptoms, no recent travel or sick contacts.   Past Medical/Surgical History: Past Medical History:  Diagnosis Date   GERD (gastroesophageal reflux disease)    Heart murmur    Migraines    UTI (urinary tract infection)     Past Surgical History:  Procedure Laterality Date   CESAREAN SECTION     x2    Social History:  reports that she has never smoked. She has never used smokeless tobacco. She reports that she does not currently use alcohol. She reports that she does not use drugs.  Allergies: No Known Allergies  Family History:  Family History  Problem Relation Age of Onset   Hypertension Mother    Rheum arthritis Mother    Diabetes Father    Stomach cancer Neg Hx    Colon cancer Neg Hx    Esophageal cancer Neg Hx      Current Outpatient Medications:    Multiple Vitamins-Calcium (ONE-A-DAY WOMENS FORMULA PO), Take by mouth., Disp: , Rfl:   Review of Systems:  Negative unless indicated in HPI.   Physical Exam: Vitals:   09/13/22 1125  BP: 110/70  Pulse: 98  Temp: 98.3 F (36.8 C)  TempSrc: Oral  SpO2: 100%  Weight: 128 lb (58.1 kg)    Body mass index is 25 kg/m.   Physical Exam Vitals reviewed.  Constitutional:      Appearance: Normal appearance.  HENT:     Right Ear: Tympanic membrane, ear canal and external ear normal.     Left Ear: Tympanic membrane, ear canal and external ear normal.     Mouth/Throat:     Mouth: Mucous membranes are moist.     Pharynx: Oropharynx is clear.  Eyes:     Conjunctiva/sclera: Conjunctivae normal.     Pupils: Pupils are equal, round, and reactive  to light.  Cardiovascular:     Rate and Rhythm: Normal rate and regular rhythm.  Pulmonary:     Effort: Pulmonary effort is normal.     Breath sounds: Normal breath sounds.  Neurological:     Mental Status: She is alert.      Impression and Plan:  Jaw pain  -No other URI symptoms, clear tympanic membranes.  Suspect related to teeth grinding.  She will discuss with her dental care provider.  Time spent:22 minutes reviewing chart, interviewing and examining patient and formulating plan of care.     Lelon Frohlich, MD [facility] at [facility]

## 2022-10-06 ENCOUNTER — Encounter: Payer: Self-pay | Admitting: Gastroenterology

## 2022-10-06 ENCOUNTER — Ambulatory Visit (INDEPENDENT_AMBULATORY_CARE_PROVIDER_SITE_OTHER): Payer: 59 | Admitting: Gastroenterology

## 2022-10-06 VITALS — BP 90/60 | HR 76 | Ht 61.0 in | Wt 131.0 lb

## 2022-10-06 DIAGNOSIS — R110 Nausea: Secondary | ICD-10-CM

## 2022-10-06 DIAGNOSIS — R1013 Epigastric pain: Secondary | ICD-10-CM | POA: Diagnosis not present

## 2022-10-06 DIAGNOSIS — R140 Abdominal distension (gaseous): Secondary | ICD-10-CM

## 2022-10-06 NOTE — Patient Instructions (Signed)
You have been scheduled for a CT scan of the abdomen and pelvis at [facility], 1st floor Radiology. You are scheduled on 10/11/22 at 3:30pm. You should  Arrive at 1:30pm    Please follow the written instructions below on the day of your exam:   1) Do not eat anything after 11:30am (4 hours prior to your test)     You may take any medications as prescribed with a small amount of water, if necessary. If you take any of the following medications: METFORMIN, GLUCOPHAGE, GLUCOVANCE, AVANDAMET, RIOMET, FORTAMET, [place] MET, JANUMET, GLUMETZA or METAGLIP, you MAY be asked to HOLD this medication 48 hours AFTER the exam.   The purpose of you drinking the oral contrast is to aid in the visualization of your intestinal tract. The contrast solution may cause some diarrhea. Depending on your individual set of symptoms, you may also receive an intravenous injection of x-ray contrast/dye. Plan on being at [facility] for 45 minutes or longer, depending on the type of exam you are having performed.   If you have any questions regarding your exam or if you need to reschedule, you may call [facility] at [phone] between the hours of 8:00 am and 5:00 pm, Monday-Friday.   The [facility] providers would like to encourage you to use [facility] to communicate with providers for non-urgent requests or questions.  Due to long hold times on the telephone, sending your provider a message by [facility] may be a faster and more efficient way to get a response.  Please allow 48 business hours for a response.  Please remember that this is for non-urgent requests.   Due to recent changes in healthcare laws, you may see the results of your imaging and laboratory studies on MyChart before your provider has had a chance to review them.  We understand that in some cases there may be results that are confusing or concerning to you. Not all laboratory results come back in the same time frame and the provider may be  waiting for multiple results in order to interpret others.  Please give [place] 48 hours in order for your provider to thoroughly review all the results before contacting the office for clarification of your results.

## 2022-10-06 NOTE — Progress Notes (Signed)
Chief Complaint:    Abdominal bloating  GI History: 32 year old female follows in the GI clinic for the following:  Abdominal bloating, gas/pressure feeling in the mid abdomen, and belching.  No heartburn, regurgitation, dysphagia.  Symptoms started early 10/17/21.  Initially tried treating with supplements, herbals, etc. with a Natural Herbalist, along with a Naturalist without much change.  Had clinical improvement with change in probiotic, mullen, fenyl tea and "blood cleanser" tea - 03/2022: H/H 11.3/33.4 with MCV/RDW 89/13.6, along with WBC 3.6.  Normal B12, TSH.  Vitamin D 23.  Treated with ergocalciferol. - 08/06/2022: Initial appointment in GI clinic.  H/H 12/35.6, MCV/RDW 90/14, WBC 7.1.  Vitamin D 33 (was given another course of ergocalciferol by [facility]).  Negative/normal celiac panel.  Referred for SIBO breath testing and H. pylori testing.  Patient declined EGD.  Started Prilosec 40 mg daily. - 08/16/2022: ER evaluation for central chest pain.  H/H12.4/37, WBC 6.1.  Normal CMP.  Normal CXR.   Father died in 10-17-2021 with Pancreatic CA. No known family history of CRC, GI malignancy, liver disease, or IBD.    HPI:     Patient is a 32 y.o. female presenting to the Gastroenterology Clinic for follow-up.   She initially did not take the omeprazole that I recommended at the last appointment.  Has not completed SIBO breath testing.  Reports having H. pylori breath testing ordered by her Naturalist and it was negative (was done at [facility] per patient).  She stopped eating dairy.    Started having recurrence of epigastric pain and nausea without emesis, so she started taking the omeprazole 40 mg daily about 2.5 weeks ago with clinical improvement.  No pain currently.  Denies heartburn, regurgitation.  No overt bleeding.  Review of systems:     No chest pain, no SOB, no fevers, no urinary sx   Past Medical History:  Diagnosis Date   GERD (gastroesophageal reflux disease)    Heart murmur     Migraines    UTI (urinary tract infection)     Patient's surgical history, family medical history, social history, medications and allergies were all reviewed in Epic    Current Outpatient Medications  Medication Sig Dispense Refill   Multiple Vitamins-Calcium (ONE-A-DAY WOMENS FORMULA PO) Take by mouth.     omeprazole (PRILOSEC) 40 MG capsule Take 40 mg by mouth daily.     No current facility-administered medications for this visit.    Physical Exam:     BP 90/60   Pulse 76   Ht '5\' 1"'$  (1.549 m)   Wt 131 lb (59.4 kg)   BMI 24.75 kg/m   GENERAL:  Pleasant female in NAD PSYCH: : Cooperative, normal affect EENT:  conjunctiva pink, mucous membranes moist, neck supple without masses CARDIAC:  RRR, no murmur heard, no peripheral edema PULM: Normal respiratory effort, lungs CTA bilaterally, no wheezing ABDOMEN: Minimal TTP in epigastrium without rebound, guarding, peritoneal signs.  Nondistended, soft. No obvious masses, no hepatomegaly,  normal bowel sounds SKIN:  turgor, no lesions seen Musculoskeletal:  Normal muscle tone, normal strength NEURO: Alert and oriented x 3, no focal neurologic deficits   IMPRESSION and PLAN:    1) Epigastric pain 2) Nausea without vomiting 3) Abdominal bloating Discussed broad DDx to include atypical GERD, gastritis, PUD, nonulcer dyspepsia.  Has had good clinical response to Prilosec.  I discussed options to include 1) continue PPI and monitoring clinically, 2) EGD, 3) cross-sectional imaging.  She initially wanted to proceed with EGD, but  quite fearful after reading the informed consent re: any potential side effect of sedation, and would instead like to proceed with cross-sectional imaging. - CT abdomen/pelvis with contrast - Resume Prilosec 40 mg daily for now - If symptoms still controlled after 4 weeks of PPI, recommend decreasing to 40 mg qod and monitoring.  If still well-controlled, can potentially wean off - Per patient preference,  holding off on EGD - Suspect some degree of underlying anxiety, which may or may not be playing an additive role with her GI symptomatology.  Can follow-up with PCM regarding this.  4) Vitamin D deficiency Vitamin D improved on last recheck.  If planning on EGD, would perform duodenal biopsies to rule out malabsorption  I spent over 30 minutes of time, including in depth chart review, independent review of results as outlined above, communicating results with the patient directly, face-to-face time with the patient, coordinating care, and ordering studies and medications as appropriate, and documentation.    Lavena Bullion ,DO, FACG 10/06/2022, 3:18 PM

## 2022-10-07 ENCOUNTER — Encounter: Payer: Self-pay | Admitting: Radiology

## 2022-10-07 ENCOUNTER — Ambulatory Visit (INDEPENDENT_AMBULATORY_CARE_PROVIDER_SITE_OTHER): Payer: 59 | Admitting: Radiology

## 2022-10-07 ENCOUNTER — Other Ambulatory Visit (HOSPITAL_COMMUNITY)
Admission: RE | Admit: 2022-10-07 | Discharge: 2022-10-07 | Disposition: A | Discharge status: Home / Self Care | Payer: 59 | Source: Ambulatory Visit | Attending: Radiology | Admitting: Radiology

## 2022-10-07 VITALS — BP 122/62 | Ht 61.5 in | Wt 131.0 lb

## 2022-10-07 DIAGNOSIS — Z1151 Encounter for screening for human papillomavirus (HPV): Secondary | ICD-10-CM

## 2022-10-07 DIAGNOSIS — Z01419 Encounter for gynecological examination (general) (routine) without abnormal findings: Secondary | ICD-10-CM | POA: Diagnosis not present

## 2022-10-07 DIAGNOSIS — Z30018 Encounter for initial prescription of other contraceptives: Secondary | ICD-10-CM

## 2022-10-07 MED ORDER — PHEXXI 1.8-1-0.4 % VA GEL: 5.0000 g | VAGINAL | 10 refills | Status: DC

## 2022-10-07 NOTE — Progress Notes (Signed)
   Tammy Ballard 30-Mar-1991 [id number]   History:  32 y.o. G2P2 presents for annual exam as a new patient. No gyn concerns. Open to nonhormonal BC options.  Gynecologic History Patient's last menstrual period was 09/24/2022 (exact date). Period Cycle (Days): 28 Period Duration (Days): 5 Period Pattern: Regular Menstrual Flow: Moderate Menstrual Control: Maxi pad Dysmenorrhea: (!) Mild Dysmenorrhea Symptoms: Cramping Contraception/Family planning: rhythm method Sexually active: yes Last Pap: 2019. Results were: normal   Obstetric History OB History  Gravida Para Term Preterm AB Living  2 2       2   SAB IAB Ectopic Multiple Live Births               # Outcome Date GA Lbr Len/2nd Weight Sex Delivery Anes PTL Lv  2 Para           1 Para              The following portions of the patient's history were reviewed and updated as appropriate: allergies, current medications, past family history, past medical history, past social history, past surgical history, and problem list.  Review of Systems Pertinent items noted in HPI and remainder of comprehensive ROS otherwise negative.   Past medical history, past surgical history, family history and social history were all reviewed and documented in the EPIC chart.   Exam:  Vitals:   10/07/22 1450  BP: 122/62  Weight: 131 lb (59.4 kg)  Height: 5' 1.5" (1.562 m)   Body mass index is 24.35 kg/m.  General appearance:  Normal Thyroid:  Symmetrical, normal in size, without palpable masses or nodularity. Respiratory  Auscultation:  Clear without wheezing or rhonchi Cardiovascular  Auscultation:  Regular rate, without rubs, murmurs or gallops  Edema/varicosities:  Not grossly evident Abdominal  Soft,nontender, without masses, guarding or rebound.  Liver/spleen:  No organomegaly noted  Hernia:  None appreciated  Skin  Inspection:  Grossly normal Breasts: Examined lying and sitting.   Right: Without masses,  retractions, nipple discharge or axillary adenopathy.   Left: Without masses, retractions, nipple discharge or axillary adenopathy. Genitourinary   Inguinal/mons:  Normal without inguinal adenopathy  External genitalia:  Normal appearing vulva with no masses, tenderness, or lesions  BUS/Urethra/Skene's glands:  Normal without masses or exudate  Vagina:  Normal appearing with normal color and discharge, no lesions  Cervix:  Normal appearing without discharge or lesions  Uterus:  Normal in size, shape and contour.  Mobile, nontender  Adnexa/parametria:     Rt: Normal in size, without masses or tenderness.   Lt: Normal in size, without masses or tenderness.  Anus and perineum: Normal   Patient informed chaperone available to be present for breast and pelvic exam. Patient has requested no chaperone to be present. Patient has been advised what will be completed during breast and pelvic exam.   Assessment/Plan:   1. Well woman exam with routine gynecological exam  - Cytology - PAP( [place])  2. Encounter for initial prescription of other contraceptives  - Lactic Ac-Citric Ac-Pot Bitart (PHEXXI) 1.8-1-0.4 % GEL; Place 5 g vaginally as directed. Up to 1 hour before intercourse  Dispense: 180 g; Refill: 10     Discussed SBE, colonoscopy and DEXA screening as directed/appropriate. Recommend 152mins of exercise weekly, including weight bearing exercise. Encouraged the use of seatbelts and sunscreen. Return in 1 year for annual or as needed.   Rubbie Battiest B WHNP-BC 3:41 PM 10/07/2022

## 2022-10-08 ENCOUNTER — Telehealth: Payer: Self-pay

## 2022-10-08 NOTE — Telephone Encounter (Signed)
PA initiated for Phexxi through covermymeds. KEY:  [id number] Patient informed.

## 2022-10-11 ENCOUNTER — Encounter: Payer: Self-pay | Admitting: Internal Medicine

## 2022-10-11 ENCOUNTER — Ambulatory Visit (HOSPITAL_COMMUNITY): Admission: RE | Admit: 2022-10-11 | Payer: 59 | Source: Ambulatory Visit

## 2022-10-11 ENCOUNTER — Telehealth (INDEPENDENT_AMBULATORY_CARE_PROVIDER_SITE_OTHER): Payer: 59 | Admitting: Internal Medicine

## 2022-10-11 VITALS — Wt 128.0 lb

## 2022-10-11 DIAGNOSIS — R519 Headache, unspecified: Secondary | ICD-10-CM | POA: Diagnosis not present

## 2022-10-11 LAB — CYTOLOGY - PAP
Adequacy: ABSENT
Comment: NEGATIVE
Diagnosis: NEGATIVE
High risk HPV: NEGATIVE

## 2022-10-11 MED ORDER — TRAMADOL HCL 50 MG PO TABS: 50.0000 mg | ORAL_TABLET | Freq: Every evening | ORAL | 0 refills | Status: AC | PRN

## 2022-10-11 NOTE — Progress Notes (Signed)
Virtual Visit via Video Note  I connected with Tammy Ballard on 10/11/22 at  2:00 PM EDT by a video enabled telemedicine application and verified that I am speaking with the correct person using two identifiers.  Location patient: home Location provider: work office Persons participating in the virtual visit: patient, provider  I discussed the limitations of evaluation and management by telemedicine and the availability of in person appointments. The patient expressed understanding and agreed to proceed.   HPI: She has scheduled this visit to discuss headaches.  They have been present for about 2 weeks.  They are over her forehead and crown of her head sometimes encompassing her left eye.  She has had URI symptoms previously.  She feels congested.  When she bends over she feels a little lightheaded.  She is requesting a prescription for tramadol.  She states these have helped her in the past with headaches.  OTC medication like Tylenol and Excedrin have not been effective.   ROS: Negative unless indicated in HPI.  Past Medical History:  Diagnosis Date   GERD (gastroesophageal reflux disease)    Heart murmur    Migraines    UTI (urinary tract infection)     Past Surgical History:  Procedure Laterality Date   CESAREAN SECTION     x2    Family History  Problem Relation Age of Onset   Hypertension Mother    Rheum arthritis Mother    Diabetes Father    Stomach cancer Neg Hx    Colon cancer Neg Hx    Esophageal cancer Neg Hx     SOCIAL HX:   reports that she has never smoked. She has never been exposed to tobacco smoke. She has never used smokeless tobacco. She reports that she does not currently use alcohol. She reports that she does not use drugs.   Current Outpatient Medications:    Lactic Ac-Citric Ac-Pot Bitart (PHEXXI) 1.8-1-0.4 % GEL, Place 5 g vaginally as directed. Up to 1 hour before intercourse, Disp: 180 g, Rfl: 10   Multiple Vitamins-Calcium (ONE-A-DAY  WOMENS FORMULA PO), Take by mouth., Disp: , Rfl:    omeprazole (PRILOSEC) 40 MG capsule, Take 40 mg by mouth daily., Disp: , Rfl:    traMADol (ULTRAM) 50 MG tablet, Take 1 tablet (50 mg total) by mouth at bedtime as needed for up to 5 days., Disp: 15 tablet, Rfl: 0  EXAM:   VITALS per patient if applicable: None reported  GENERAL: alert, oriented, appears well and in no acute distress  HEENT: atraumatic, conjunttiva clear, no obvious abnormalities on inspection of external nose and ears  NECK: normal movements of the head and neck  LUNGS: on inspection no signs of respiratory distress, breathing rate appears normal, no obvious gross increased work of breathing, gasping or wheezing  CV: no obvious cyanosis  MS: moves all visible extremities without noticeable abnormality  PSYCH/NEURO: pleasant and cooperative, no obvious depression or anxiety, speech and thought processing grossly intact  ASSESSMENT AND PLAN:   Sinus headache -Advise she use combination of guaifenesin and antihistamine daily which she agrees. -Agreed to prescribe a short course of tramadol 50 mg, number 15 tablets.    I discussed the assessment and treatment plan with the patient. The patient was provided an opportunity to ask questions and all were answered. The patient agreed with the plan and demonstrated an understanding of the instructions.   The patient was advised to call back or seek an in-person evaluation if the  symptoms worsen or if the condition fails to improve as anticipated.    Lelon Frohlich, MD  [facility]

## 2022-10-12 ENCOUNTER — Other Ambulatory Visit: Payer: Self-pay | Admitting: *Deleted

## 2022-10-12 MED ORDER — FLUCONAZOLE 150 MG PO TABS: 150.0000 mg | ORAL_TABLET | Freq: Once | ORAL | 0 refills | Status: AC

## 2022-10-19 ENCOUNTER — Encounter: Payer: Self-pay | Admitting: Gastroenterology

## 2022-10-27 ENCOUNTER — Ambulatory Visit (HOSPITAL_COMMUNITY)
Admission: RE | Admit: 2022-10-27 | Discharge: 2022-10-27 | Disposition: A | Discharge status: Home / Self Care | Payer: 59 | Source: Ambulatory Visit | Attending: Gastroenterology | Admitting: Gastroenterology

## 2022-10-27 DIAGNOSIS — R110 Nausea: Secondary | ICD-10-CM | POA: Diagnosis present

## 2022-10-27 DIAGNOSIS — R140 Abdominal distension (gaseous): Secondary | ICD-10-CM | POA: Insufficient documentation

## 2022-10-27 DIAGNOSIS — R1013 Epigastric pain: Secondary | ICD-10-CM | POA: Diagnosis not present

## 2022-10-27 MED ORDER — SODIUM CHLORIDE (PF) 0.9 % IJ SOLN
INTRAMUSCULAR | Status: AC
  Filled 2022-10-27: qty 50

## 2022-10-27 MED ORDER — IOHEXOL 300 MG/ML  SOLN
80.0000 mL | Freq: Once | INTRAMUSCULAR | Status: AC | PRN
  Administered 2022-10-27: 80 mL via INTRAVENOUS

## 2022-11-03 ENCOUNTER — Telehealth: Payer: Self-pay

## 2022-11-03 ENCOUNTER — Ambulatory Visit (AMBULATORY_SURGERY_CENTER): Payer: 59

## 2022-11-03 VITALS — Ht 61.0 in | Wt 130.0 lb

## 2022-11-03 DIAGNOSIS — R110 Nausea: Secondary | ICD-10-CM

## 2022-11-03 DIAGNOSIS — R140 Abdominal distension (gaseous): Secondary | ICD-10-CM

## 2022-11-03 DIAGNOSIS — R1013 Epigastric pain: Secondary | ICD-10-CM

## 2022-11-03 NOTE — Progress Notes (Signed)
No egg or soy allergy known to patient  No issues known to pt with past sedation with any surgeries or procedures Patient denies ever being told they had issues or difficulty with intubation  No FH of Malignant Hyperthermia Pt is not on diet pills Pt is not on  home 02  Pt is not on blood thinners  Pt with constipation  No A fib or A flutter Have any cardiac testing pending--no  Pt instructed to use Singlecare.com or GoodRx for a price reduction on prep   Patient's chart reviewed by Cathlyn Parsons CNRA prior to previsit and patient appropriate for the [facility].  Previsit completed and red dot placed by patient's name on their procedure day (on provider's schedule).

## 2022-11-04 NOTE — Telephone Encounter (Signed)
Made in error

## 2022-11-24 ENCOUNTER — Encounter: Payer: Self-pay | Admitting: Gastroenterology

## 2022-12-03 ENCOUNTER — Encounter: Payer: 59 | Admitting: Gastroenterology

## 2022-12-15 ENCOUNTER — Ambulatory Visit (AMBULATORY_SURGERY_CENTER): Payer: 59 | Admitting: Gastroenterology

## 2022-12-15 ENCOUNTER — Encounter: Payer: Self-pay | Admitting: Gastroenterology

## 2022-12-15 VITALS — BP 103/63 | HR 69 | Temp 98.6°F | Resp 14 | Ht 61.0 in | Wt 130.0 lb

## 2022-12-15 DIAGNOSIS — R110 Nausea: Secondary | ICD-10-CM | POA: Diagnosis not present

## 2022-12-15 DIAGNOSIS — R140 Abdominal distension (gaseous): Secondary | ICD-10-CM | POA: Diagnosis not present

## 2022-12-15 DIAGNOSIS — K2950 Unspecified chronic gastritis without bleeding: Secondary | ICD-10-CM

## 2022-12-15 DIAGNOSIS — R1013 Epigastric pain: Secondary | ICD-10-CM | POA: Diagnosis not present

## 2022-12-15 MED ORDER — SODIUM CHLORIDE 0.9 % IV SOLN: 500.0000 mL | Freq: Once | INTRAVENOUS | Status: AC

## 2022-12-15 NOTE — Progress Notes (Signed)
Uneventful anesthetic. Report to pacu rn. Vss. Care resumed by rn. 

## 2022-12-15 NOTE — Op Note (Signed)
[facility] Patient Name: Tammy Ballard Procedure Date: 12/15/2022 9:51 AM MRN: [id number] Endoscopist: Doristine Locks , MD, [id number] Age: 32 Referring MD:  Date of Birth: 15-Feb-1991 Gender: Female Account #: [id number] Procedure:                Upper GI endoscopy Indications:              Epigastric abdominal pain, Abdominal bloating,                            Nausea Medicines:                Monitored Anesthesia Care Procedure:                Pre-Anesthesia Assessment:                           - Prior to the procedure, a History and Physical                            was performed, and patient medications and                            allergies were reviewed. The patient's tolerance of                            previous anesthesia was also reviewed. The risks                            and benefits of the procedure and the sedation                            options and risks were discussed with the patient.                            All questions were answered, and informed consent                            was obtained. Prior Anticoagulants: The patient has                            taken no anticoagulant or antiplatelet agents. ASA                            Grade Assessment: I - A normal, healthy patient.                            After reviewing the risks and benefits, the patient                            was deemed in satisfactory condition to undergo the                            procedure.  After obtaining informed consent, the endoscope was                            passed under direct vision. Throughout the                            procedure, the patient's blood pressure, pulse, and                            oxygen saturations were monitored continuously. The                            Olympus Scope [id number] was introduced through the                            mouth, and advanced to the second part of duodenum.                             The upper GI endoscopy was accomplished without                            difficulty. The patient tolerated the procedure                            well. Scope In: Scope Out: Findings:                 The examined esophagus was normal.                           The Z-line was regular and was found 36 cm from the                            incisors.                           The entire examined stomach was normal. Biopsies                            were taken with a cold forceps for Helicobacter                            pylori testing. Estimated blood loss was minimal.                           The examined duodenum was normal. Biopsies were                            taken with a cold forceps for histology. Estimated                            blood loss was minimal. Complications:            No immediate complications. Estimated Blood Loss:     Estimated blood loss was minimal. Impression:               -  Normal esophagus.                           - Z-line regular, 36 cm from the incisors.                           - Normal stomach. Biopsied.                           - Normal examined duodenum. Biopsied. Recommendation:           - Patient has a contact number available for                            emergencies. The signs and symptoms of potential                            delayed complications were discussed with the                            patient. Return to normal activities tomorrow.                            Written discharge instructions were provided to the                            patient.                           - Resume previous diet.                           - Continue present medications.                           - Await pathology results. Doristine Locks, MD 12/15/2022 10:04:25 AM

## 2022-12-15 NOTE — Progress Notes (Signed)
GASTROENTEROLOGY PROCEDURE H&P NOTE   Primary Care Physician: Philip Aspen, Limmie Patricia, MD    Reason for Procedure:   Epigastric pain, nausea, bloating  Plan:    EGD  Patient is appropriate for endoscopic procedure(s) in the ambulatory ([facility]) setting.  The nature of the procedure, as well as the risks, benefits, and alternatives were carefully and thoroughly reviewed with the patient. Ample time for discussion and questions allowed. The patient understood, was satisfied, and agreed to proceed.     HPI: Tammy Ballard is a 32 y.o. female who presents for EGD for evaluation of MEG pain, nausea, bloating, and suspected atypical GERD.   Recent CT A/P was n/f 2.3 cm uterine fibroid, but o/w normal.    Past Medical History:  Diagnosis Date   GERD (gastroesophageal reflux disease)    Heart murmur    Migraines    UTI (urinary tract infection)     Past Surgical History:  Procedure Laterality Date   CESAREAN SECTION     x2    Prior to Admission medications   Medication Sig Start Date End Date Taking? Authorizing Provider  omeprazole (PRILOSEC) 40 MG capsule Take 40 mg by mouth daily. 10/05/22  Yes [provider]  Lactic Ac-Citric Ac-Pot Bitart (PHEXXI) 1.8-1-0.4 % GEL Place 5 g vaginally as directed. Up to 1 hour before intercourse 10/07/22   Chrzanowski, Clearnce Hasten B, NP  Multiple Vitamins-Calcium (ONE-A-DAY WOMENS FORMULA PO) Take by mouth.    [provider]    Current Outpatient Medications  Medication Sig Dispense Refill   omeprazole (PRILOSEC) 40 MG capsule Take 40 mg by mouth daily.     Lactic Ac-Citric Ac-Pot Bitart (PHEXXI) 1.8-1-0.4 % GEL Place 5 g vaginally as directed. Up to 1 hour before intercourse 180 g 10   Multiple Vitamins-Calcium (ONE-A-DAY WOMENS FORMULA PO) Take by mouth.     Current Facility-Administered Medications  Medication Dose Route Frequency Provider Last Rate Last Admin   0.9 %  sodium chloride infusion  500 mL Intravenous  Once Arul Farabee V, DO        Allergies as of 12/15/2022   (No Known Allergies)    Family History  Problem Relation Age of Onset   Hypertension Mother    Rheum arthritis Mother    Diabetes Father    Stomach cancer Neg Hx    Colon cancer Neg Hx    Esophageal cancer Neg Hx     Social History   Socioeconomic History   Marital status: Married    Spouse name: Not on file   Number of children: 2   Years of education: Not on file   Highest education level: Associate degree: academic program  Occupational History   Not on file  Tobacco Use   Smoking status: Never    Passive exposure: Never   Smokeless tobacco: Never  Vaping Use   Vaping Use: Never used  Substance and Sexual Activity   Alcohol use: Not Currently    Comment: occasional   Drug use: Never   Sexual activity: Yes    Partners: Male    Birth control/protection: None    Comment: menarche 32yo, sexual debut 32yo  Other Topics Concern   Not on file  Social History Narrative   Not on file   Social Determinants of Health   Financial Resource Strain: Patient Declined (12/23/2021)   Overall Financial Resource Strain (CARDIA)    Difficulty of Paying Living Expenses: Patient declined  Food Insecurity: Patient Declined (12/23/2021)  Hunger Vital Sign    Worried About Running Out of Food in the Last Year: Patient declined    Ran Out of Food in the Last Year: Patient declined  Transportation Needs: Patient Declined (12/23/2021)   PRAPARE - Administrator, Civil Service (Medical): Patient declined    Lack of Transportation (Non-Medical): Patient declined  Physical Activity: Insufficiently Active (12/23/2021)   Exercise Vital Sign    Days of Exercise per Week: 3 days    Minutes of Exercise per Session: 30 min  Stress: No Stress Concern Present (12/23/2021)   [employer] of Occupational Health - Occupational Stress Questionnaire    Feeling of Stress : Not at all  Social Connections: Unknown  (12/23/2021)   Social Connection and Isolation Panel [NHANES]    Frequency of Communication with Friends and Family: More than three times a week    Frequency of Social Gatherings with Friends and Family: Patient declined    Attends Religious Services: Patient declined    Database administrator or Organizations: No    Attends Engineer, structural: Not on file    Marital Status: Patient declined  Catering manager Violence: Not on file    Physical Exam: Vital signs in last 24 hours: @BP  (!) 101/58   Pulse 81   Temp 98.6 F (37 C) (Temporal)   Ht 5\' 1"  (1.549 m)   Wt 130 lb (59 kg)   SpO2 99%   BMI 24.56 kg/m  GEN: NAD EYE: Sclerae anicteric ENT: MMM CV: Non-tachycardic Pulm: CTA b/l GI: Soft, NT/ND NEURO:  Alert & Oriented x 3   Doristine Locks, DO [facility]   12/15/2022 9:46 AM

## 2022-12-15 NOTE — Patient Instructions (Signed)
Thank you for letting us take care of your healthcare needs today.     YOU HAD AN ENDOSCOPIC PROCEDURE TODAY AT THE [facility]:   Refer to the procedure report that was given to you for any specific questions about what was found during the examination.  If the procedure report does not answer your questions, please call your gastroenterologist to clarify.  If you requested that your care partner not be given the details of your procedure findings, then the procedure report has been included in a sealed envelope for you to review at your convenience later.  YOU SHOULD EXPECT: Some feelings of bloating in the abdomen. Passage of more gas than usual.  Walking can help get rid of the air that was put into your GI tract during the procedure and reduce the bloating. If you had a lower endoscopy (such as a colonoscopy or flexible sigmoidoscopy) you may notice spotting of blood in your stool or on the toilet paper. If you underwent a bowel prep for your procedure, you may not have a normal bowel movement for a few days.  Please Note:  You might notice some irritation and congestion in your nose or some drainage.  This is from the oxygen used during your procedure.  There is no need for concern and it should clear up in a day or so.  SYMPTOMS TO REPORT IMMEDIATELY:   Following upper endoscopy (EGD)  Vomiting of blood or coffee ground material  New chest pain or pain under the shoulder blades  Painful or persistently difficult swallowing  New shortness of breath  Fever of 100F or higher  Black, tarry-looking stools  For urgent or emergent issues, a gastroenterologist can be reached at any hour by calling [phone]. Do not use MyChart messaging for urgent concerns.    DIET:  We do recommend a small meal at first, but then you may proceed to your regular diet.  Drink plenty of fluids but you should avoid alcoholic beverages for 24 hours.  ACTIVITY:  You should plan to take it  easy for the rest of today and you should NOT DRIVE or use heavy machinery until tomorrow (because of the sedation medicines used during the test).    FOLLOW UP: Our staff will call the number listed on your records the next business day following your procedure.  We will call around 7:15- 8:00 am to check on you and address any questions or concerns that you may have regarding the information given to you following your procedure. If we do not reach you, we will leave a message.     If any biopsies were taken you will be contacted by phone or by letter within the next 1-3 weeks.  Please call us at [phone] if you have not heard about the biopsies in 3 weeks.    SIGNATURES/CONFIDENTIALITY: You and/or your care partner have signed paperwork which will be entered into your electronic medical record.  These signatures attest to the fact that that the information above on your After Visit Summary has been reviewed and is understood.  Full responsibility of the confidentiality of this discharge information lies with you and/or your care-partner. 

## 2022-12-15 NOTE — Progress Notes (Signed)
Called to room to assist during endoscopic procedure.  Patient ID and intended procedure confirmed with present staff. Received instructions for my participation in the procedure from the performing physician.  

## 2022-12-15 NOTE — Progress Notes (Signed)
VS completed by DT.  Pt's states no medical or surgical changes since previsit or office visit.  

## 2022-12-16 ENCOUNTER — Telehealth: Payer: Self-pay | Admitting: *Deleted

## 2022-12-16 NOTE — Telephone Encounter (Signed)
  Follow up Call-     12/15/2022    9:15 AM  Call back number  Post procedure Call Back phone  # [phone]  Permission to leave phone message Yes     Patient questions:   Message left to call us if necessary.

## 2022-12-23 ENCOUNTER — Encounter: Payer: Self-pay | Admitting: Gastroenterology

## 2023-03-31 ENCOUNTER — Ambulatory Visit: Payer: 59 | Admitting: Gastroenterology

## 2023-07-01 ENCOUNTER — Ambulatory Visit: Payer: 59 | Admitting: Gastroenterology

## 2023-07-19 ENCOUNTER — Other Ambulatory Visit: Payer: Self-pay | Admitting: Internal Medicine

## 2023-07-25 ENCOUNTER — Other Ambulatory Visit: Payer: Self-pay

## 2023-07-25 MED ORDER — OMEPRAZOLE 40 MG PO CPDR
40.0000 mg | DELAYED_RELEASE_CAPSULE | Freq: Every day | ORAL | 1 refills | Status: DC
  Filled 2023-07-25: qty 90, 90d supply, fill #0

## 2023-08-08 ENCOUNTER — Other Ambulatory Visit: Payer: Self-pay | Admitting: Internal Medicine

## 2023-08-08 ENCOUNTER — Other Ambulatory Visit: Payer: Self-pay

## 2023-08-08 MED ORDER — OMEPRAZOLE 40 MG PO CPDR: 40.0000 mg | DELAYED_RELEASE_CAPSULE | Freq: Every day | ORAL | 1 refills | Status: DC

## 2023-10-06 ENCOUNTER — Encounter: Payer: Self-pay | Admitting: Gastroenterology

## 2023-10-06 ENCOUNTER — Ambulatory Visit: Payer: 59 | Admitting: Gastroenterology

## 2023-10-06 VITALS — BP 100/60 | HR 88 | Ht 60.0 in | Wt 126.5 lb

## 2023-10-06 DIAGNOSIS — R1013 Epigastric pain: Secondary | ICD-10-CM | POA: Diagnosis not present

## 2023-10-06 DIAGNOSIS — R110 Nausea: Secondary | ICD-10-CM

## 2023-10-06 NOTE — Progress Notes (Signed)
 Chief Complaint:    Nausea  GI History: 33 year old female follows in the GI clinic for the following:   Abdominal bloating, gas/pressure feeling in the mid abdomen, and belching.  No heartburn, regurgitation, dysphagia.  Symptoms started early 2021-10-14.  Initially tried treating with supplements, herbals, etc. with a Natural Herbalist, along with a Naturalist without much change.  Had clinical improvement with change in probiotic, mullen, fenyl tea and "blood cleanser" tea - 03/2022: H/H 11.3/33.4 with MCV/RDW 89/13.6, along with WBC 3.6.  Normal B12, TSH.  Vitamin D 23.  Treated with ergocalciferol. - 08/06/2022: Initial appointment in GI clinic.  H/H 12/35.6, MCV/RDW 90/14, WBC 7.1.  Vitamin D 33 (was given another course of ergocalciferol by [facility]).  Negative/normal celiac panel.  Referred for SIBO breath testing (not completed) and H. pylori testing (reports this was done by her Naturalist and was negative).  Patient declined EGD.  Started Prilosec 40 mg daily. - 08/16/2022: ER evaluation for central chest pain.  H/H 12.4/37, WBC 6.1.  Normal CMP.  Normal CXR. - 10/06/2022: Follow-up in the GI clinic.  Recurrence of epigastric pain and nausea.  Started omeprazole 40 mg daily with resolution of abdominal pain.  Ordered EGD, referred for CT, continued Prilosec with plan to titrate off if symptoms resolved - 10/27/2022: CT A/P: 2.3 cm uterine fibroid, otherwise normal - 12/15/2022: EGD: Normal with biopsies negative for H. pylori and celiac     Father died in October 14, 2021 with Pancreatic CA. No known family history of CRC, GI malignancy, liver disease, or IBD.    HPI:     Patient is a 33 y.o. female presenting to the Gastroenterology Clinic for evaluation of nausea.  Was last seen by me in the office on 10/06/2022 for epigastric pain and nausea.  Symptoms resolved with omeprazole.  EGD and CT were essentially normal as above.  Today, she states she has had nausea without any emesis for the last week or so.     Has been taking omeprazole 40 mg/day since October again due to dyspepsia, which resolved after restarting PPI.   Uses turmuric and apple cider vinegar with good response. Maintains healthy diet. Consumes bone broth, herbal tea, and a few herbal supplements.   Otherwise no new labs or abdominal imaging for review since last appointment.  Review of systems:     No chest pain, no SOB, no fevers, no urinary sx   Past Medical History:  Diagnosis Date   GERD (gastroesophageal reflux disease)    Heart murmur    Migraines    UTI (urinary tract infection)     Patient's surgical history, family medical history, social history, medications and allergies were all reviewed in Epic    Current Outpatient Medications  Medication Sig Dispense Refill   Lactic Ac-Citric Ac-Pot Bitart (PHEXXI) 1.8-1-0.4 % GEL Place 5 g vaginally as directed. Up to 1 hour before intercourse 180 g 10   Multiple Vitamins-Calcium (ONE-A-DAY WOMENS FORMULA PO) Take by mouth.     omeprazole (PRILOSEC) 40 MG capsule Take 1 capsule (40 mg total) by mouth daily. 90 capsule 1   Current Facility-Administered Medications  Medication Dose Route Frequency Provider Last Rate Last Admin   0.9 %  sodium chloride infusion  500 mL Intravenous Once Dakoda Bassette V, DO        Physical Exam:     There were no vitals taken for this visit.  GENERAL:  Pleasant female in NAD PSYCH: : Cooperative, normal affect EENT:  conjunctiva pink,  mucous membranes moist, neck supple without masses CARDIAC:  RRR, no murmur heard, no peripheral edema PULM: Normal respiratory effort, lungs CTA bilaterally, no wheezing ABDOMEN:  Nondistended, soft, nontender. No obvious masses, no hepatomegaly,  normal bowel sounds SKIN:  turgor, no lesions seen Musculoskeletal:  Normal muscle tone, normal strength NEURO: Alert and oriented x 3, no focal neurologic deficits   IMPRESSION and PLAN:    1) Dyspepsia 2) Nausea Clinical presentation consistent  with nonulcer dyspepsia.  Discussed diagnosis at length today plan for the following: - Continue omeprazole 40 mg daily for now given good clinical response - Start OTC FDgard or peppermint oil - Try low FODMAP diet - If symptoms resolved, can trial coming off the PPI as tolerated - If persistent/recurrent symptoms, can again refer for SIBO testing   RTC prn          Shellia Cleverly ,DO, FACG 10/06/2023, 8:37 AM

## 2023-10-06 NOTE — Patient Instructions (Signed)
 _______________________________________________________  If your blood pressure at your visit was 140/90 or greater, please contact your primary care physician to follow up on this.  If you are age 35 or younger, your body mass index should be between 19-25. Your Body mass index is 24.71 kg/m. If this is out of the aformentioned range listed, please consider follow up with your Primary Care Provider.  ________________________________________________________  The [facility] providers would like to encourage you to use [facility] to communicate with providers for non-urgent requests or questions.  Due to long hold times on the telephone, sending your provider a message by [facility] may be a faster and more efficient way to get a response.  Please allow 48 business hours for a response.  Please remember that this is for non-urgent requests.  _______________________________________________________  Bonita Quin can use peppermint oil.  Low-FODMAP Eating Plan  FODMAP stands for fermentable oligosaccharides, disaccharides, monosaccharides, and polyols. These are sugars that are hard for some people to digest. A low-FODMAP eating plan may help some people who have irritable bowel syndrome (IBS) and certain other bowel (intestinal) diseases to manage their symptoms. This meal plan can be complicated to follow. Work with a diet and nutrition specialist (dietitian) to make a low-FODMAP eating plan that is right for you. A dietitian can help make sure that you get enough nutrition from this diet. What are tips for following this plan? Reading food labels Check labels for hidden FODMAPs such as: High-fructose syrup. Honey. Agave. Natural fruit flavors. Onion or garlic powder. Choose low-FODMAP foods that contain 3-4 grams of fiber per serving. Check food labels for serving sizes. Eat only one serving at a time to make sure FODMAP levels stay low. Shopping Shop with a list of foods that are recommended on this diet  and make a meal plan. Meal planning Follow a low-FODMAP eating plan for up to 6 weeks, or as told by your health care provider or dietitian. To follow the eating plan: Eliminate high-FODMAP foods from your diet completely. Choose only low-FODMAP foods to eat. You will do this for 2-6 weeks. Gradually reintroduce high-FODMAP foods into your diet one at a time. Most people should wait a few days before introducing the next new high-FODMAP food into their meal plan. Your dietitian can recommend how quickly you may reintroduce foods. Keep a daily record of what and how much you eat and drink. Make note of any symptoms that you have after eating. Review your daily record with a dietitian regularly to identify which foods you can eat and which foods you should avoid. General tips Drink enough fluid each day to keep your urine pale yellow. Avoid processed foods. These often have added sugar and may be high in FODMAPs. Avoid most dairy products, whole grains, and sweeteners. Work with a dietitian to make sure you get enough fiber in your diet. Avoid high FODMAP foods at meals to manage symptoms. Recommended foods Fruits Bananas, oranges, tangerines, lemons, limes, blueberries, raspberries, strawberries, grapes, cantaloupe, honeydew melon, kiwi, papaya, passion fruit, and pineapple. Limited amounts of dried cranberries, banana chips, and shredded coconut. Vegetables Eggplant, zucchini, cucumber, peppers, green beans, bean sprouts, lettuce, arugula, kale, Swiss chard, spinach, collard greens, bok choy, summer squash, potato, and tomato. Limited amounts of corn, carrot, and sweet potato. Green parts of scallions. Grains Gluten-free grains, such as rice, oats, buckwheat, quinoa, corn, polenta, and millet. Gluten-free pasta, bread, or cereal. Rice noodles. Corn tortillas. Meats and other proteins Unseasoned beef, pork, poultry, or fish. Eggs. Tomasa Blase.  Tofu (firm) and tempeh. Limited amounts of nuts and seeds,  such as almonds, walnuts, [place] nuts, pecans, peanuts, nut butters, pumpkin seeds, chia seeds, and sunflower seeds. Dairy Lactose-free milk, yogurt, and kefir. Lactose-free cottage cheese and ice cream. Non-dairy milks, such as almond, coconut, hemp, and rice milk. Non-dairy yogurt. Limited amounts of goat cheese, brie, mozzarella, parmesan, swiss, and other hard cheeses. Fats and oils Butter-free spreads. Vegetable oils, such as olive, canola, and sunflower oil. Seasoning and other foods Artificial sweeteners with names that do not end in "ol," such as aspartame, saccharine, and stevia. Maple syrup, white table sugar, raw sugar, brown sugar, and molasses. Mayonnaise, soy sauce, and tamari. Fresh basil, coriander, parsley, rosemary, and thyme. Beverages Water and mineral water. Sugar-sweetened soft drinks. Small amounts of orange juice or cranberry juice. Black and green tea. Most dry wines. Coffee. The items listed above may not be a complete list of foods and beverages you can eat. Contact a dietitian for more information. Foods to avoid Fruits Fresh, dried, and juiced forms of apple, pear, watermelon, peach, plum, cherries, apricots, blackberries, boysenberries, figs, nectarines, and mango. Avocado. Vegetables Chicory root, artichoke, asparagus, cabbage, snow peas, Brussels sprouts, broccoli, sugar snap peas, mushrooms, celery, and cauliflower. Onions, garlic, leeks, and the white part of scallions. Grains Wheat, including kamut, durum, and semolina. Barley and bulgur. Couscous. Wheat-based cereals. Wheat noodles, bread, crackers, and pastries. Meats and other proteins Fried or fatty meat. Sausage. Cashews and pistachios. Soybeans, baked beans, black beans, chickpeas, kidney beans, fava beans, navy beans, lentils, black-eyed peas, and split peas. Dairy Milk, yogurt, ice cream, and soft cheese. Cream and sour cream. Milk-based sauces. Custard. Buttermilk. Soy milk. Seasoning and other  foods Any sugar-free gum or candy. Foods that contain artificial sweeteners such as sorbitol, mannitol, isomalt, or xylitol. Foods that contain honey, high-fructose corn syrup, or agave. Bouillon, vegetable stock, beef stock, and chicken stock. Garlic and onion powder. Condiments made with onion, such as hummus, chutney, pickles, relish, salad dressing, and salsa. Tomato paste. Beverages Chicory-based drinks. Coffee substitutes. Chamomile tea. Fennel tea. Sweet or fortified wines such as port or sherry. Diet soft drinks made with isomalt, mannitol, maltitol, sorbitol, or xylitol. Apple, pear, and mango juice. Juices with high-fructose corn syrup. The items listed above may not be a complete list of foods and beverages you should avoid. Contact a dietitian for more information. Summary FODMAP stands for fermentable oligosaccharides, disaccharides, monosaccharides, and polyols. These are sugars that are hard for some people to digest. A low-FODMAP eating plan is a short-term diet that helps to ease symptoms of certain bowel diseases. The eating plan usually lasts up to 6 weeks. After that, high-FODMAP foods are reintroduced gradually and one at a time. This can help you find out which foods may be causing symptoms. A low-FODMAP eating plan can be complicated. It is best to work with a dietitian who has experience with this type of plan. This information is not intended to replace advice given to you by your health care provider. Make sure you discuss any questions you have with your health care provider. Document Revised: 11/29/2019 Document Reviewed: 11/29/2019 Elsevier Patient Education  2024 [employer].  It was a pleasure to see you today!  Vito Cirigliano, D.O.

## 2023-10-13 ENCOUNTER — Ambulatory Visit: Payer: 59 | Admitting: Radiology

## 2023-12-14 ENCOUNTER — Encounter: Admitting: Internal Medicine

## 2024-01-03 ENCOUNTER — Ambulatory Visit (INDEPENDENT_AMBULATORY_CARE_PROVIDER_SITE_OTHER): Admitting: Radiology

## 2024-01-03 ENCOUNTER — Encounter: Payer: Self-pay | Admitting: Radiology

## 2024-01-03 ENCOUNTER — Ambulatory Visit: Payer: Self-pay | Admitting: Radiology

## 2024-01-03 VITALS — BP 98/56 | HR 90 | Ht 62.5 in | Wt 128.6 lb

## 2024-01-03 DIAGNOSIS — Z2821 Immunization not carried out because of patient refusal: Secondary | ICD-10-CM

## 2024-01-03 DIAGNOSIS — N898 Other specified noninflammatory disorders of vagina: Secondary | ICD-10-CM

## 2024-01-03 DIAGNOSIS — Z789 Other specified health status: Secondary | ICD-10-CM | POA: Diagnosis not present

## 2024-01-03 DIAGNOSIS — Z01419 Encounter for gynecological examination (general) (routine) without abnormal findings: Secondary | ICD-10-CM | POA: Diagnosis not present

## 2024-01-03 DIAGNOSIS — Z1331 Encounter for screening for depression: Secondary | ICD-10-CM

## 2024-01-03 LAB — WET PREP FOR TRICH, YEAST, CLUE

## 2024-01-03 NOTE — Progress Notes (Signed)
 Tammy Ballard 1991-07-02 [id number]   History:  33 y.o. G2P2 presents for annual exam. C/o vaginal discharge, no itching or burning. Rhythm method for BC. Normal pap last year, has not had HPV vaccine, declines vaccine. 2.3cm fibroid seen on CT done by GI last year, asymptomatic.   Gynecologic History Patient's last menstrual period was 12/11/2023 (exact date). Period Cycle (Days): 28 Period Duration (Days): 4 Period Pattern: Regular Menstrual Flow: Heavy, Moderate Menstrual Control: Thin pad, Maxi pad Dysmenorrhea: (!) Mild Dysmenorrhea Symptoms: Cramping Contraception/Family planning: rhythm method Sexually active: yes Last Pap: 2024. Results were: normal   Obstetric History OB History  Gravida Para Term Preterm AB Living  2 2    2   SAB IAB Ectopic Multiple Live Births          # Outcome Date GA Lbr Len/2nd Weight Sex Type Anes PTL Lv  2 Para           1 Para                01/03/2024    8:58 AM 10/11/2022    1:44 PM 03/27/2020    7:24 AM  Depression screen PHQ 2/9  Decreased Interest 0 0 0  Down, Depressed, Hopeless 0 0 0  PHQ - 2 Score 0 0 0  Altered sleeping  0 0  Tired, decreased energy  0 0  Change in appetite  0 0  Feeling bad or failure about yourself   0 0  Trouble concentrating  0 0  Moving slowly or fidgety/restless  0 0  Suicidal thoughts  0 0  PHQ-9 Score  0 0  Difficult doing work/chores  Not difficult at all Not difficult at all     The following portions of the patient's history were reviewed and updated as appropriate: allergies, current medications, past family history, past medical history, past social history, past surgical history, and problem list.  Review of Systems  All other systems reviewed and are negative.   Narrative & Impression  CLINICAL DATA:  Epigastric pain and nausea for 4 months. Bloating. Anorexia.   EXAM: CT ABDOMEN AND PELVIS WITH CONTRAST   TECHNIQUE: Multidetector CT imaging of the abdomen and pelvis was  performed using the standard protocol following bolus administration of intravenous contrast.   RADIATION DOSE REDUCTION: This exam was performed according to the departmental dose-optimization program which includes automated exposure control, adjustment of the mA and/or kV according to patient size and/or use of iterative reconstruction technique.   CONTRAST:  80mL OMNIPAQUE  IOHEXOL  300 MG/ML  SOLN   COMPARISON:  Noncontrast CT on 04/23/2020   FINDINGS: Lower Chest: No acute findings.   Hepatobiliary: No hepatic masses identified. Gallbladder is unremarkable. No evidence of biliary ductal dilatation.   Pancreas:  No mass or inflammatory changes.   Spleen: Within normal limits in size and appearance.   Adrenals/Urinary Tract: No suspicious masses identified. No evidence of ureteral calculi or hydronephrosis.   Stomach/Bowel: No evidence of obstruction, inflammatory process or abnormal fluid collections. Normal appendix visualized.   Vascular/Lymphatic: No pathologically enlarged lymph nodes. No acute vascular findings.   Reproductive: Small anterior uterine fibroid seen measuring proximally 2.3 cm. Adnexal regions are unremarkable.   Other:  None.   Musculoskeletal:  No suspicious bone lesions identified.   IMPRESSION: No acute findings within the abdomen or pelvis.   Small uterine fibroid.     Electronically Signed   By: Marlyce Sine M.D.   On: 10/27/2022 16:14    Past  medical history, past surgical history, family history and social history were all reviewed and documented in the EPIC chart.  Exam:  Vitals:   01/03/24 0856  BP: (!) 98/56  Pulse: 90  SpO2: 96%  Weight: 128 lb 9.6 oz (58.3 kg)  Height: 5' 2.5" (1.588 m)   Body mass index is 23.15 kg/m.  Physical Exam Vitals and nursing note reviewed. Exam conducted with a chaperone present.  Constitutional:      Appearance: Normal appearance. She is normal weight.  HENT:     Head: Normocephalic  and atraumatic.  Neck:     Thyroid : No thyroid  mass, thyromegaly or thyroid  tenderness.  Cardiovascular:     Rate and Rhythm: Regular rhythm.     Heart sounds: Normal heart sounds.  Pulmonary:     Effort: Pulmonary effort is normal.     Breath sounds: Normal breath sounds.  Chest:  Breasts:    Breasts are symmetrical.     Right: Normal. No inverted nipple, mass, nipple discharge, skin change or tenderness.     Left: Normal. No inverted nipple, mass, nipple discharge, skin change or tenderness.  Abdominal:     General: Abdomen is flat. Bowel sounds are normal.     Palpations: Abdomen is soft.  Genitourinary:    General: Normal vulva.     Vagina: Normal. No vaginal discharge, bleeding or lesions.     Cervix: Normal. No discharge or lesion.     Uterus: Normal. Not enlarged and not tender.      Adnexa: Right adnexa normal and left adnexa normal.       Right: No mass, tenderness or fullness.         Left: No mass, tenderness or fullness.    Lymphadenopathy:     Upper Body:     Right upper body: No axillary adenopathy.     Left upper body: No axillary adenopathy.  Skin:    General: Skin is warm and dry.  Neurological:     Mental Status: She is alert and oriented to person, place, and time.  Psychiatric:        Mood and Affect: Mood normal.        Thought Content: Thought content normal.        Judgment: Judgment normal.      Ellis Guys, CMA present for exam  Assessment/Plan:   1. Well woman exam with routine gynecological exam (Primary) Pap 2027  2. Vaginal discharge Will contact with results - WET PREP FOR TRICH, YEAST, CLUE  3. Depression screening negative  4. Human papilloma virus (HPV) vaccination declined  5. Uses rhythm method as primary birth control method    Return in about 1 year (around 01/02/2025) for Annual.  Tashe Purdon B WHNP-BC 9:29 AM 01/03/2024

## 2024-01-03 NOTE — Patient Instructions (Signed)

## 2024-01-04 ENCOUNTER — Other Ambulatory Visit: Payer: Self-pay | Admitting: Radiology

## 2024-01-04 DIAGNOSIS — N760 Acute vaginitis: Secondary | ICD-10-CM

## 2024-01-04 MED ORDER — NUVESSA 1.3 % VA GEL: 1.0000 | Freq: Once | VAGINAL | 0 refills | Status: AC

## 2024-02-13 ENCOUNTER — Ambulatory Visit: Admitting: Internal Medicine

## 2024-02-20 ENCOUNTER — Ambulatory Visit: Admitting: Internal Medicine

## 2024-02-28 ENCOUNTER — Ambulatory Visit: Admitting: Internal Medicine

## 2024-03-07 ENCOUNTER — Ambulatory Visit: Admitting: Internal Medicine

## 2024-03-28 ENCOUNTER — Ambulatory Visit: Admitting: Internal Medicine

## 2024-04-19 ENCOUNTER — Ambulatory Visit: Admitting: Internal Medicine

## 2024-04-19 ENCOUNTER — Encounter: Payer: Self-pay | Admitting: Internal Medicine

## 2024-04-19 ENCOUNTER — Ambulatory Visit: Payer: Self-pay | Admitting: Internal Medicine

## 2024-04-19 VITALS — BP 95/58 | HR 88 | Temp 98.2°F | Ht 62.75 in | Wt 128.8 lb

## 2024-04-19 DIAGNOSIS — E559 Vitamin D deficiency, unspecified: Secondary | ICD-10-CM | POA: Diagnosis not present

## 2024-04-19 DIAGNOSIS — Z0000 Encounter for general adult medical examination without abnormal findings: Secondary | ICD-10-CM | POA: Diagnosis not present

## 2024-04-19 DIAGNOSIS — K219 Gastro-esophageal reflux disease without esophagitis: Secondary | ICD-10-CM | POA: Diagnosis not present

## 2024-04-19 LAB — COMPREHENSIVE METABOLIC PANEL WITH GFR
ALT: 12 U/L (ref 0–35)
AST: 15 U/L (ref 0–37)
Albumin: 4.7 g/dL (ref 3.5–5.2)
Alkaline Phosphatase: 48 U/L (ref 39–117)
BUN: 13 mg/dL (ref 6–23)
CO2: 26 meq/L (ref 19–32)
Calcium: 10 mg/dL (ref 8.4–10.5)
Chloride: 102 meq/L (ref 96–112)
Creatinine, Ser: 0.83 mg/dL (ref 0.40–1.20)
GFR: 91.38 mL/min (ref >60.00)
Glucose, Bld: 84 mg/dL (ref 70–99)
Potassium: 4.4 meq/L (ref 3.5–5.1)
Sodium: 136 meq/L (ref 135–145)
Total Bilirubin: 0.8 mg/dL (ref 0.2–1.2)
Total Protein: 7.9 g/dL (ref 6.0–8.3)

## 2024-04-19 LAB — CBC WITH DIFFERENTIAL/PLATELET
Basophils Absolute: 0 K/uL (ref 0.0–0.1)
Basophils Relative: 0.3 % (ref 0.0–3.0)
Eosinophils Absolute: 0 K/uL (ref 0.0–0.7)
Eosinophils Relative: 0.7 % (ref 0.0–5.0)
HCT: 35.1 % — ABNORMAL LOW (ref 36.0–46.0)
Hemoglobin: 11.7 g/dL — ABNORMAL LOW (ref 12.0–15.0)
Lymphocytes Relative: 38.2 % (ref 12.0–46.0)
Lymphs Abs: 2 K/uL (ref 0.7–4.0)
MCHC: 33.4 g/dL (ref 30.0–36.0)
MCV: 88.5 fl (ref 78.0–100.0)
Monocytes Absolute: 0.2 K/uL (ref 0.1–1.0)
Monocytes Relative: 4.8 % (ref 3.0–12.0)
Neutro Abs: 2.9 K/uL (ref 1.4–7.7)
Neutrophils Relative %: 56 % (ref 43.0–77.0)
Platelets: 276 K/uL (ref 150.0–400.0)
RBC: 3.97 Mil/uL (ref 3.87–5.11)
RDW: 14.3 % (ref 11.5–15.5)
WBC: 5.2 K/uL (ref 4.0–10.5)

## 2024-04-19 LAB — LIPID PANEL
Cholesterol: 137 mg/dL (ref 0–200)
HDL: 52.6 mg/dL (ref >39.00)
LDL Cholesterol: 77 mg/dL (ref 0–99)
NonHDL: 84.38
Total CHOL/HDL Ratio: 3
Triglycerides: 37 mg/dL (ref 0.0–149.0)
VLDL: 7.4 mg/dL (ref 0.0–40.0)

## 2024-04-19 LAB — VITAMIN B12: Vitamin B-12: 776 pg/mL (ref 211–911)

## 2024-04-19 LAB — VITAMIN D 25 HYDROXY (VIT D DEFICIENCY, FRACTURES): VITD: 44.99 ng/mL (ref 30.00–100.00)

## 2024-04-19 LAB — TSH: TSH: 0.84 u[IU]/mL (ref 0.35–5.50)

## 2024-04-19 MED ORDER — OMEPRAZOLE 40 MG PO CPDR: 40.0000 mg | DELAYED_RELEASE_CAPSULE | Freq: Every day | ORAL | 1 refills | Status: AC

## 2024-04-19 NOTE — Progress Notes (Signed)
 Established Patient Office Visit     CC/Reason for Visit: Annual preventive exam  HPI: Tammy Ballard is a 33 y.o. female who is coming in today for the above mentioned reasons. Past Medical History is significant for: GERD on omeprazole .  Has been feeling well.  Has routine eye and dental care.  Is due for flu and COVID vaccines.  Has no concerns or complaints.   Past Medical/Surgical History: Past Medical History:  Diagnosis Date   Fibroid    Gastritis    GERD (gastroesophageal reflux disease)    Heart murmur    Migraines    UTI (urinary tract infection)     Past Surgical History:  Procedure Laterality Date   CESAREAN SECTION     x2    Social History:  reports that she has never smoked. She has never been exposed to tobacco smoke. She has never used smokeless tobacco. She reports that she does not currently use alcohol. She reports that she does not use drugs.  Allergies: No Known Allergies  Family History:  Family History  Problem Relation Age of Onset   Hypertension Mother    Rheum arthritis Mother    Diabetes Father    Stomach cancer Neg Hx    Colon cancer Neg Hx    Esophageal cancer Neg Hx      Current Outpatient Medications:    Multiple Vitamins-Calcium (ONE-A-DAY WOMENS FORMULA PO), Take by mouth., Disp: , Rfl:    omeprazole  (PRILOSEC) 40 MG capsule, Take 1 capsule (40 mg total) by mouth daily., Disp: 90 capsule, Rfl: 1  Current Facility-Administered Medications:    0.9 %  sodium chloride  infusion, 500 mL, Intravenous, Once, Cirigliano, Vito V, DO  Review of Systems:  Negative unless indicated in HPI.   Physical Exam: Vitals:   04/19/24 0859  BP: (!) 95/58  Pulse: 88  Temp: 98.2 F (36.8 C)  TempSrc: Oral  SpO2: 99%  Weight: 128 lb 12.8 oz (58.4 kg)  Height: 5' 2.75 (1.594 m)    Body mass index is 23 kg/m.   Physical Exam Vitals reviewed.  Constitutional:      General: She is not in acute distress.    Appearance: Normal  appearance. She is not ill-appearing, toxic-appearing or diaphoretic.  HENT:     Head: Normocephalic.     Right Ear: Tympanic membrane, ear canal and external ear normal. There is no impacted cerumen.     Left Ear: Tympanic membrane, ear canal and external ear normal. There is no impacted cerumen.     Nose: Nose normal.     Mouth/Throat:     Mouth: Mucous membranes are moist.     Pharynx: Oropharynx is clear. No oropharyngeal exudate or posterior oropharyngeal erythema.  Eyes:     General: No scleral icterus.       Right eye: No discharge.        Left eye: No discharge.     Conjunctiva/sclera: Conjunctivae normal.     Pupils: Pupils are equal, round, and reactive to light.  Neck:     Vascular: No carotid bruit.  Cardiovascular:     Rate and Rhythm: Normal rate and regular rhythm.     Pulses: Normal pulses.     Heart sounds: Normal heart sounds.  Pulmonary:     Effort: Pulmonary effort is normal. No respiratory distress.     Breath sounds: Normal breath sounds.  Abdominal:     General: Abdomen is flat. Bowel sounds are normal.  Palpations: Abdomen is soft.  Musculoskeletal:        General: Normal range of motion.     Cervical back: Normal range of motion.  Skin:    General: Skin is warm and dry.  Neurological:     General: No focal deficit present.     Mental Status: She is alert and oriented to person, place, and time. Mental status is at baseline.  Psychiatric:        Mood and Affect: Mood normal.        Behavior: Behavior normal.        Thought Content: Thought content normal.        Judgment: Judgment normal.       Impression and Plan:  Vitamin D  deficiency -     VITAMIN D  25 Hydroxy (Vit-D Deficiency, Fractures); Future  Encounter for preventive health examination -     CBC with Differential/Platelet; Future -     Comprehensive metabolic panel with GFR; Future -     Lipid panel; Future -     TSH; Future -     Vitamin B12; Future  Gastroesophageal reflux  disease, unspecified whether esophagitis present -     Omeprazole ; Take 1 capsule (40 mg total) by mouth daily.  Dispense: 90 capsule; Refill: 1   -Recommend routine eye and dental care. -Healthy lifestyle discussed in detail. -Labs to be updated today. -Prostate cancer screening: N/A Health Maintenance  Topic Date Due   COVID-19 Vaccine (4 - 2025-26 season) 05/05/2024*   Flu Shot  10/23/2024*   Hepatitis B Vaccine (1 of 3 - 19+ 3-dose series) 04/19/2025*   HPV Vaccine (1 - 3-dose SCDM series) 04/19/2025*   Pap with HPV screening  10/07/2027   DTaP/Tdap/Td vaccine (2 - Td or Tdap) 11/08/2031   Hepatitis C Screening  Completed   HIV Screening  Completed   Pneumococcal Vaccine  Aged Out   Meningitis B Vaccine  Aged Out  *Topic was postponed. The date shown is not the original due date.    - Declines flu vaccination today. - Has routine GYN care    Tammy Vandenberghe Theophilus Andrews, MD [facility]

## 2024-08-01 ENCOUNTER — Ambulatory Visit (INDEPENDENT_AMBULATORY_CARE_PROVIDER_SITE_OTHER): Admitting: Radiology

## 2024-08-01 ENCOUNTER — Encounter: Payer: Self-pay | Admitting: Radiology

## 2024-08-01 ENCOUNTER — Ambulatory Visit: Admitting: Internal Medicine

## 2024-08-01 VITALS — BP 94/58 | Wt 130.0 lb

## 2024-08-01 DIAGNOSIS — N760 Acute vaginitis: Secondary | ICD-10-CM

## 2024-08-01 LAB — WET PREP FOR TRICH, YEAST, CLUE

## 2024-08-01 MED ORDER — METRONIDAZOLE 0.75 % VA GEL: 1.0000 | Freq: Every day | VAGINAL | 1 refills | Status: AC

## 2024-08-01 NOTE — Progress Notes (Signed)
" ° ° ° ° °  Subjective: Tammy Ballard is a 34 y.o. female who complains of vaginal discharge with itching, burning, odor x's 2 weeks. Used monistat which helped with the burning some. Did not use last night.    Review of Systems  All other systems reviewed and are negative.   Past Medical History:  Diagnosis Date   Fibroid    Gastritis    GERD (gastroesophageal reflux disease)    Heart murmur    Migraines    UTI (urinary tract infection)       Objective:  Today's Vitals   08/01/24 1548  BP: (!) 94/58  Weight: 130 lb (59 kg)   Body mass index is 23.21 kg/m.   Physical Exam Vitals and nursing note reviewed. Exam conducted with a chaperone present.  Constitutional:      Appearance: Normal appearance. She is well-developed.  Pulmonary:     Effort: Pulmonary effort is normal.  Abdominal:     General: Abdomen is flat.     Palpations: Abdomen is soft.  Genitourinary:    General: Normal vulva.     Vagina: Vaginal discharge present. No erythema, bleeding or lesions.     Cervix: Normal. No discharge, friability, lesion or erythema.     Uterus: Normal.      Adnexa: Right adnexa normal and left adnexa normal.  Neurological:     Mental Status: She is alert.  Psychiatric:        Mood and Affect: Mood normal.        Thought Content: Thought content normal.        Judgment: Judgment normal.     Microscopic wet-mount exam shows clue cells.   Darice Hoit, CMA present for exam  Assessment:/Plan:  1. Acute vaginitis (Primary) +BV - WET PREP FOR TRICH, YEAST, CLUE - metroNIDAZOLE  (METROGEL ) 0.75 % vaginal gel; Place 1 Applicatorful vaginally at bedtime for 5 days.  Dispense: 70 g; Refill: 1      Avoid intercourse until symptoms are resolved. Safe sex encouraged. Avoid the use of soaps or perfumed products in the peri area. Avoid tub baths and sitting in sweaty or wet clothing for prolonged periods of time.    Ashanta Amoroso B, NP 3:55 PM  "
